# Patient Record
Sex: Male | Born: 1945 | Race: White | Hispanic: No | Marital: Married | Smoking: Former smoker
Health system: Southern US, Community
[De-identification: ages and names within clinical notes are randomized; demographics above are authoritative.]

## PROBLEM LIST (undated history)

## (undated) DIAGNOSIS — G20 Parkinson's disease: Secondary | ICD-10-CM

## (undated) DIAGNOSIS — G20A1 Parkinson's disease without dyskinesia, without mention of fluctuations: Secondary | ICD-10-CM

## (undated) DIAGNOSIS — K469 Unspecified abdominal hernia without obstruction or gangrene: Secondary | ICD-10-CM

## (undated) HISTORY — DX: Unspecified abdominal hernia without obstruction or gangrene: K46.9

## (undated) HISTORY — DX: Parkinson's disease: G20

## (undated) HISTORY — DX: Parkinson's disease without dyskinesia, without mention of fluctuations: G20.A1

## (undated) HISTORY — PX: HERNIA REPAIR: SHX51

---

## 2001-12-26 ENCOUNTER — Emergency Department (HOSPITAL_COMMUNITY): Admission: EM | Admit: 2001-12-26 | Discharge: 2001-12-26 | Payer: Self-pay | Admitting: Emergency Medicine

## 2005-12-02 ENCOUNTER — Encounter: Admission: RE | Admit: 2005-12-02 | Discharge: 2005-12-02 | Payer: Self-pay | Admitting: General Surgery

## 2008-10-10 ENCOUNTER — Encounter: Admission: RE | Admit: 2008-10-10 | Discharge: 2008-10-10 | Payer: Self-pay | Admitting: Family Medicine

## 2014-05-11 ENCOUNTER — Other Ambulatory Visit: Payer: Self-pay | Admitting: Family Medicine

## 2014-05-11 DIAGNOSIS — R296 Repeated falls: Secondary | ICD-10-CM

## 2014-05-13 ENCOUNTER — Ambulatory Visit
Admission: RE | Admit: 2014-05-13 | Discharge: 2014-05-13 | Disposition: A | Discharge status: Home / Self Care | Payer: Medicare Other | Source: Ambulatory Visit | Attending: Family Medicine | Admitting: Family Medicine

## 2014-05-13 DIAGNOSIS — R296 Repeated falls: Secondary | ICD-10-CM

## 2014-05-13 MED ORDER — IOHEXOL 300 MG/ML  SOLN
75.0000 mL | Freq: Once | INTRAMUSCULAR | Status: AC | PRN
  Administered 2014-05-13: 75 mL via INTRAVENOUS

## 2014-06-08 ENCOUNTER — Ambulatory Visit: Payer: Medicare Other | Attending: Family Medicine | Admitting: Physical Therapy

## 2014-06-08 DIAGNOSIS — Z9181 History of falling: Secondary | ICD-10-CM | POA: Diagnosis not present

## 2014-06-08 DIAGNOSIS — R269 Unspecified abnormalities of gait and mobility: Secondary | ICD-10-CM | POA: Insufficient documentation

## 2014-06-08 DIAGNOSIS — G20 Parkinson's disease: Secondary | ICD-10-CM | POA: Diagnosis not present

## 2014-06-08 DIAGNOSIS — IMO0001 Reserved for inherently not codable concepts without codable children: Secondary | ICD-10-CM | POA: Diagnosis present

## 2014-06-08 DIAGNOSIS — G20A1 Parkinson's disease without dyskinesia, without mention of fluctuations: Secondary | ICD-10-CM | POA: Insufficient documentation

## 2014-06-14 ENCOUNTER — Ambulatory Visit: Payer: Medicare Other | Admitting: Physical Therapy

## 2014-06-14 DIAGNOSIS — IMO0001 Reserved for inherently not codable concepts without codable children: Secondary | ICD-10-CM | POA: Diagnosis not present

## 2014-06-17 ENCOUNTER — Ambulatory Visit: Payer: Medicare Other | Admitting: Physical Therapy

## 2014-06-17 DIAGNOSIS — IMO0001 Reserved for inherently not codable concepts without codable children: Secondary | ICD-10-CM | POA: Diagnosis not present

## 2014-06-20 ENCOUNTER — Ambulatory Visit: Payer: Medicare Other | Admitting: Physical Therapy

## 2014-06-22 ENCOUNTER — Ambulatory Visit: Payer: Medicare Other | Admitting: Physical Therapy

## 2014-06-22 DIAGNOSIS — IMO0001 Reserved for inherently not codable concepts without codable children: Secondary | ICD-10-CM | POA: Diagnosis not present

## 2014-06-27 ENCOUNTER — Ambulatory Visit: Payer: Medicare Other | Admitting: Physical Therapy

## 2014-06-27 DIAGNOSIS — IMO0001 Reserved for inherently not codable concepts without codable children: Secondary | ICD-10-CM | POA: Diagnosis not present

## 2014-06-29 ENCOUNTER — Ambulatory Visit: Payer: Medicare Other | Attending: Family Medicine | Admitting: Physical Therapy

## 2014-06-29 DIAGNOSIS — Z9181 History of falling: Secondary | ICD-10-CM | POA: Insufficient documentation

## 2014-06-29 DIAGNOSIS — G20A1 Parkinson's disease without dyskinesia, without mention of fluctuations: Secondary | ICD-10-CM | POA: Insufficient documentation

## 2014-06-29 DIAGNOSIS — G20 Parkinson's disease: Secondary | ICD-10-CM | POA: Insufficient documentation

## 2014-06-29 DIAGNOSIS — IMO0001 Reserved for inherently not codable concepts without codable children: Secondary | ICD-10-CM | POA: Insufficient documentation

## 2014-06-29 DIAGNOSIS — R269 Unspecified abnormalities of gait and mobility: Secondary | ICD-10-CM | POA: Insufficient documentation

## 2014-07-06 ENCOUNTER — Ambulatory Visit: Payer: Medicare Other | Admitting: Physical Therapy

## 2014-07-06 DIAGNOSIS — IMO0001 Reserved for inherently not codable concepts without codable children: Secondary | ICD-10-CM | POA: Diagnosis not present

## 2014-07-11 ENCOUNTER — Ambulatory Visit: Payer: Medicare Other | Admitting: Physical Therapy

## 2014-07-11 DIAGNOSIS — IMO0001 Reserved for inherently not codable concepts without codable children: Secondary | ICD-10-CM | POA: Diagnosis not present

## 2014-07-13 ENCOUNTER — Ambulatory Visit: Payer: Medicare Other | Admitting: Physical Therapy

## 2014-07-13 DIAGNOSIS — IMO0001 Reserved for inherently not codable concepts without codable children: Secondary | ICD-10-CM | POA: Diagnosis not present

## 2014-07-18 ENCOUNTER — Ambulatory Visit: Payer: Medicare Other | Admitting: Physical Therapy

## 2014-07-18 DIAGNOSIS — IMO0001 Reserved for inherently not codable concepts without codable children: Secondary | ICD-10-CM | POA: Diagnosis not present

## 2014-07-20 ENCOUNTER — Ambulatory Visit: Payer: Medicare Other | Admitting: Physical Therapy

## 2014-07-20 DIAGNOSIS — IMO0001 Reserved for inherently not codable concepts without codable children: Secondary | ICD-10-CM | POA: Diagnosis not present

## 2014-07-25 ENCOUNTER — Ambulatory Visit: Payer: Medicare Other | Admitting: Physical Therapy

## 2014-07-25 DIAGNOSIS — IMO0001 Reserved for inherently not codable concepts without codable children: Secondary | ICD-10-CM | POA: Diagnosis not present

## 2014-07-27 ENCOUNTER — Ambulatory Visit: Payer: Medicare Other | Admitting: Physical Therapy

## 2014-07-27 DIAGNOSIS — IMO0001 Reserved for inherently not codable concepts without codable children: Secondary | ICD-10-CM | POA: Diagnosis not present

## 2014-08-03 ENCOUNTER — Ambulatory Visit: Payer: Medicare Other | Attending: Family Medicine | Admitting: Physical Therapy

## 2014-08-03 DIAGNOSIS — R279 Unspecified lack of coordination: Secondary | ICD-10-CM | POA: Insufficient documentation

## 2014-08-03 DIAGNOSIS — R269 Unspecified abnormalities of gait and mobility: Secondary | ICD-10-CM | POA: Diagnosis not present

## 2014-08-03 DIAGNOSIS — F0280 Dementia in other diseases classified elsewhere without behavioral disturbance: Secondary | ICD-10-CM | POA: Diagnosis not present

## 2014-08-03 DIAGNOSIS — G20 Parkinson's disease: Secondary | ICD-10-CM | POA: Insufficient documentation

## 2014-08-05 ENCOUNTER — Ambulatory Visit: Payer: Medicare Other | Admitting: Physical Therapy

## 2014-08-05 DIAGNOSIS — G20 Parkinson's disease: Secondary | ICD-10-CM | POA: Diagnosis not present

## 2015-02-21 ENCOUNTER — Ambulatory Visit: Payer: Medicare Other | Admitting: Occupational Therapy

## 2015-02-21 ENCOUNTER — Ambulatory Visit: Payer: Medicare Other | Attending: Family Medicine | Admitting: Physical Therapy

## 2015-02-21 DIAGNOSIS — F0280 Dementia in other diseases classified elsewhere without behavioral disturbance: Secondary | ICD-10-CM | POA: Insufficient documentation

## 2015-02-21 DIAGNOSIS — R279 Unspecified lack of coordination: Secondary | ICD-10-CM

## 2015-02-21 DIAGNOSIS — R269 Unspecified abnormalities of gait and mobility: Secondary | ICD-10-CM | POA: Insufficient documentation

## 2015-02-21 DIAGNOSIS — G20 Parkinson's disease: Secondary | ICD-10-CM | POA: Insufficient documentation

## 2015-02-21 NOTE — Therapy (Signed)
[facility] [address] [place], [place], [address] Phone: [phone]   Fax:  [phone]  Physical Therapy Treatment  Patient Details  Name: Manuel Jackson MRN: [id number] Date of Birth: [birth year] Referring Provider:  Blair HeysEhinger, Robert, MD  Encounter Date: 02/21/2015    No past medical history on file.  No past surgical history on file.  There were no vitals filed for this visit.  Visit Diagnosis:  Abnormality of gait       Physical Therapy Parkinson's Disease Screen   Timed Up and Go test:15.77 seconds  10 meter walk test: 3.39 ft/sec  5 time sit to stand test:10.15 seconds with posterior lean  Patient would benefit from Physical Therapy evaluation due to slight decline in functional mobility, continued freezing with gait, difficulty with bed mobility per patient report.                                         Problem List There are no active problems to display for this patient.   Ona Rathert W. 02/21/2015, 2:09 PM  Lonia BloodAmy Jamieka Royle, PT 02/21/2015 2:12 PM Phone: [phone] Fax: [phone]   [facility] [address] [place], [place], [address] Phone: [phone]   Fax:  [phone]

## 2015-02-21 NOTE — Therapy (Signed)
[facility] [address] [place], [place], [address] Phone: [phone]   Fax:  [phone]  [facility]  Patient Details  Name: Manuel IrishWilliam E Eliasen MRN: [id number] Date of Birth: 1946-10-26 Referring Provider:  Blair HeysEhinger, Robert, MD  Encounter Date: 02/21/2015    No past medical history on file.  No past surgical history on file.  There were no vitals filed for this visit.  Visit Diagnosis:  Lack of coordination      Occupational Therapy Parkinson's Disease Screen  Physical Performance Test item #4 (donning/doffing jacket):  18.63sec  9-hole peg test:    RUE  37.10sec        LUE  36.03sec  Box & Blocks Test:   RUE  37 blocks        LUE  46 blocks  Change in ability to perform ADLs/IADLs:  Has had 2 falls (step ladder, fell in parking lot), difficulty getting out of bed.     Pt would benefit from occupational therapy evaluation due to  Decreased coordination/functional reaching.                                    Problem List There are no active problems to display for this patient.   [facility] ClinicFREEMAN,ANGELA 02/21/2015, 1:51 PM  [facility] [address] [place], [place], [address] Phone: [phone]   Fax:  [phone]  Willa Fraterngela Freeman, OTR/L 02/21/2015 1:51 PM

## 2015-08-01 ENCOUNTER — Ambulatory Visit (INDEPENDENT_AMBULATORY_CARE_PROVIDER_SITE_OTHER): Payer: Medicare Other | Admitting: Neurology

## 2015-08-01 ENCOUNTER — Encounter: Payer: Self-pay | Admitting: Neurology

## 2015-08-01 VITALS — BP 110/60 | HR 73 | Wt 178.5 lb

## 2015-08-01 DIAGNOSIS — G249 Dystonia, unspecified: Secondary | ICD-10-CM | POA: Diagnosis not present

## 2015-08-01 DIAGNOSIS — F0280 Dementia in other diseases classified elsewhere without behavioral disturbance: Secondary | ICD-10-CM | POA: Diagnosis not present

## 2015-08-01 DIAGNOSIS — G20A1 Parkinson's disease without dyskinesia, without mention of fluctuations: Secondary | ICD-10-CM

## 2015-08-01 DIAGNOSIS — G20 Parkinson's disease: Secondary | ICD-10-CM

## 2015-08-01 DIAGNOSIS — G4752 REM sleep behavior disorder: Secondary | ICD-10-CM | POA: Diagnosis not present

## 2015-08-01 DIAGNOSIS — G20B1 Parkinson's disease with dyskinesia, without mention of fluctuations: Secondary | ICD-10-CM

## 2015-08-01 MED ORDER — CARBIDOPA-LEVODOPA ER 50-200 MG PO TBCR: 1.0000 | EXTENDED_RELEASE_TABLET | Freq: Every day | ORAL | Status: DC

## 2015-08-01 NOTE — Progress Notes (Signed)
Note routed to Dr Ehinger.  

## 2015-08-01 NOTE — Patient Instructions (Signed)
1. Start Carbidopa Levodopa 50/200 at night (8:00 pm).  2. Take Carbidopa Levodopa 25/100 IR take two tablet at 6:00 AM then spread 7 remaining tablets out through the day.

## 2015-08-01 NOTE — Progress Notes (Signed)
Manuel Jackson was seen today in the movement disorders clinic for neurologic consultation at the request of Dr. [name].  His PCP is [name], MD.  The patient is seen today in neurologic consultation, accompanied by his wife who supplements the history.  He has been taken care of for many years by Dr. [name], but the drive to [facility] has become long and cumbersome for him and he is trying to find a neurologist closer to home.  I have reviewed and appreciated the records from Dr. [name].  He first saw Dr. [name] and was diagnosed with Parkinson's disease in approximately 2008, when Dr. [name] was with [name] neurology.  He followed Dr. [name] to [name].  The patient's first symptom was right hand rest tremor.  He was started on Azilect first and Mirapex came not long thereafter.  He has been on levodopa since approximately 2011.  The patient felt that each of these medications helped either tremor or balance or both at the initiation of the medication, but is having more difficulty now with freezing and on/off.  He developed hallucinations in approximately May, 2013 and did not fully respond to quetiapine.  He saw [name] for a one-time consultation in 2013 and [name] decided to taper him off of Mirapex.  Unfortunately, the patient became very stiff off of Mirapex and he ended up going back on the drug and is now back on it at pramipexole, 0.25 mg 4 times per day.  He is also on the Exelon patch 9.5 mg daily, Namenda 10 mg twice a day as well as his Seroquel 25 mg bid; he is still having hallucinations despite this.  As above, he has had more difficulties with motor fluctuations and called Dr. [name] in June and his dose of carbidopa/levodopa was adjusted slightly so that he is now taking 2 tablets at 8 AM/2 at noon/1 tablet at 2 PM/1 tablet at 4 PM/2 tablets at 6:30 PM and one tablet at bedtime.  Pt states that his medication works right after he takes the medication but by 11 am he  is frozen.  He states that medication wears out after 3 hours.  His wife states that she has been taking the 2 tablets at noon and starting giving one at 10 am and one at noon and that works better.  Interestingly, he wakes up at 6 am but doesn't take first med until 8am.     Specific Symptoms:  Tremor: Yes.  , R hand and R leg (R hand dominant) Voice: hypophonic Sleep: sleeps well  Vivid Dreams:  Yes.    Acting out dreams:  Yes.   Wet Pillows: No. Postural symptoms:  Yes.    Falls?  Yes.   (last fall 3 weeks ago; never had a fx with a fall) - does PT in [place]) Bradykinesia symptoms: difficulty with initiating movement, shuffling gait, slow movements and difficulty getting out of a chair Loss of smell:  Yes.   Loss of taste:  No. Urinary Incontinence:  No. Difficulty Swallowing:  No. Handwriting, micrographia: Yes.   Trouble with ADL's:  Yes.    Trouble buttoning clothing: Yes.   Depression:  Yes.   (more frustrated than depressed) Memory changes:  Yes.  ; pt does not drives - quit driving a year ago; wife gives meds and if she is gone, she will set an alarm for him Hallucinations:  Yes.   (happens most days; will see people he used to work  with; also sees smoke that others don't see)  visual distortions: Yes.   N/V:  No. Lightheaded:  No.  Syncope: No. Diplopia:  No. Dyskinesia:  No.  MRI neuroimaging has not previously been performed.  PREVIOUS MEDICATIONS: Sinemet, Mirapex and Seroquel; he tried klonopin - 0.5 mg - full tablet at bedtime but seemed to "make worse"  ALLERGIES:   Allergies  Allergen Reactions  . Naproxen Swelling    CURRENT MEDICATIONS:  Outpatient Encounter Prescriptions as of 08/01/2015  Medication Sig  . hydrochlorothiazide (HYDRODIURIL) 25 MG tablet Take by mouth.  . pramipexole (MIRAPEX) 0.5 MG tablet Take by mouth.  . QUEtiapine (SEROQUEL) 25 MG tablet TAKE 1 TABLET BY MOUTH TWICE A DAY  . rivastigmine (EXELON) 9.5 mg/24hr Place onto  the skin.  . [DISCONTINUED] rasagiline (AZILECT) 1 MG TABS tablet TAKE 1 TABLET BY MOUTH EVERY DAY  . AZILECT 1 MG TABS tablet   . carbidopa-levodopa (SINEMET IR) 25-100 MG tablet   . memantine (NAMENDA) 10 MG tablet   . [DISCONTINUED] Cholecalciferol (VITAMIN D-1000 MAX ST) 1000 UNITS tablet Take by mouth.  . [DISCONTINUED] PEG 3350-KCl-NaBcb-NaCl-NaSulf (PEG-3350/ELECTROLYTES) 236 G SOLR    No facility-administered encounter medications on file as of 08/01/2015.    PAST MEDICAL HISTORY:   Past Medical History  Diagnosis Date  . Parkinson's disease (HCC)   . Hernia of abdominal cavity     PAST SURGICAL HISTORY:   Past Surgical History  Procedure Laterality Date  . Hernia repair      SOCIAL HISTORY:   Social History   Social History  . Marital Status: Married    Spouse Name: N/A  . Number of Children: N/A  . Years of Education: N/A   Occupational History  . Not on file.   Social History Main Topics  . Smoking status: Former Games developer  . Smokeless tobacco: Never Used  . Alcohol Use: 0.0 oz/week    0 Standard drinks or equivalent per week  . Drug Use: No  . Sexual Activity: Not on file   Other Topics Concern  . Not on file   Social History Narrative   Lives with wife in story home.  Has no children.  Retired Dietitian for [employer].  Education: associate's degree.    FAMILY HISTORY:   Family Status  Relation Status Death Age  . Mother Deceased   . Father Deceased   . Brother Deceased     ROS:  A complete 10 system review of systems was obtained and was unremarkable apart from what is mentioned above.  PHYSICAL EXAMINATION:    VITALS:   Filed Vitals:   08/01/15 1226  BP: 110/60  Pulse: 73  Weight: 178 lb 8 oz (80.967 kg)  SpO2: 93%    GEN:  The patient appears stated age and is in NAD. HEENT:  Normocephalic, atraumatic.  The mucous membranes are moist. The superficial temporal arteries are without ropiness or tenderness. CV:  RRR Lungs:   CTAB Neck/HEME:  There are no carotid bruits bilaterally.  Neurological examination:  Orientation: He scored an 8/30 on his MoCA.  The patient is alert and oriented x3. Fund of knowledge is appropriate.  Recent and remote memory are intact.  Attention and concentration are normal.    Able to name objects and repeat phrases. Cranial nerves: There is good facial symmetry.  There is significant facial hypomimia.   Pupils are equal round and reactive to light bilaterally. Fundoscopic exam reveals clear margins bilaterally. Extraocular muscles are intact. The  visual fields are full to confrontational testing. The speech is fluent and clear. Soft palate rises symmetrically and there is no tongue deviation. Hearing is intact to conversational tone. Sensation: Sensation is intact to light and pinprick throughout (facial, trunk, extremities). Vibration is decreased at the bilateral big toe. There is no extinction with double simultaneous stimulation. There is no sensory dermatomal level identified. Motor: Strength is 5/5 in the bilateral upper and lower extremities.   Shoulder shrug is equal and symmetric.  There is no pronator drift. Deep tendon reflexes: Deep tendon reflexes are 2/4 at the bilateral biceps, triceps, brachioradialis, patella and 1/4 at the bilateral achilles. Plantar responses are downgoing bilaterally.  Movement examination: Tone: There is normal tone in the bilateral upper extremities.  The tone in the lower extremities is normal.  Abnormal movements: Rare tremor in the left thumb; he is mildly dyskinetic Coordination:  There is mild decremation with RAM's, with all form of RAMS, including alternating supination and pronation of the forearm, hand opening and closing, finger taps, heel taps and toe taps.  However, apraxia with all commands is a big issue when testing this.   Gait and Station: The patient has minimal difficulty arising out of a deep-seated chair without the use of the hands.  The patient's stride length is decreased with normal arm swing (slightly exaggerated with dyskinesia on the right).  Pull test is attempted but he is too apraxic to understand the directions.    ASSESSMENT/PLAN:  1.  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  May take an extra if needed throughout the day  -add carbidopa/levodopa 50/200 CR at bedtime  -talked about nuplazid for the hallucinations/parkinsons psychosis since seroquel not controlling.  Will discuss in future as didn't want to change too many things at one time.  Continue seroquel 25 mg bid for now  -talked extensively about duopa.  Think he could be a good candidate.  Asked them to think about this option given significant on/off  -continue PT in [place]  -stressed importance of safe, CV exercise 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  Will need to monitor.  If need in future, will try 1/2 of the tablet or even less.   3.  PDD  -continue exelon patch 9.5 mg daily  -on namenda 10 mg bid.  -home safety discussed in detail.  Shouldn't be left alone. 4.  Follow up in 8 weeks.  Much greater than 50% of this visit was spent in counseling with the patient and the family.  Total face to face time:  65 min

## 2015-09-19 ENCOUNTER — Telehealth: Payer: Self-pay | Admitting: Neurology

## 2015-09-19 NOTE — Telephone Encounter (Signed)
[name]) called to inform us that patient had a fall yesterday. He did not hit his head or lose consciousness. He has some bruises on his left hand. He is also having hallucinations and insomnia for the past 3 nights. It looks like this was discussed last office visit. He has a follow up on 09/28/15.

## 2015-09-28 ENCOUNTER — Encounter: Payer: Self-pay | Admitting: Neurology

## 2015-09-28 ENCOUNTER — Ambulatory Visit (INDEPENDENT_AMBULATORY_CARE_PROVIDER_SITE_OTHER): Payer: Medicare Other | Admitting: Neurology

## 2015-09-28 VITALS — BP 116/60 | HR 65 | Ht 67.0 in | Wt 173.0 lb

## 2015-09-28 DIAGNOSIS — G249 Dystonia, unspecified: Secondary | ICD-10-CM | POA: Diagnosis not present

## 2015-09-28 DIAGNOSIS — F0280 Dementia in other diseases classified elsewhere without behavioral disturbance: Secondary | ICD-10-CM

## 2015-09-28 DIAGNOSIS — G20 Parkinson's disease: Secondary | ICD-10-CM

## 2015-09-28 DIAGNOSIS — G4752 REM sleep behavior disorder: Secondary | ICD-10-CM | POA: Diagnosis not present

## 2015-09-28 DIAGNOSIS — G20B1 Parkinson's disease with dyskinesia, without mention of fluctuations: Secondary | ICD-10-CM

## 2015-09-28 DIAGNOSIS — G20A1 Parkinson's disease without dyskinesia, without mention of fluctuations: Secondary | ICD-10-CM

## 2015-09-28 NOTE — Progress Notes (Signed)
Manuel Jackson was seen today in the movement disorders clinic for neurologic consultation at the request of [name].  His PCP is Manuel Huh, MD.  The patient is seen today in neurologic consultation, accompanied by his wife who supplements the history.  He has been taken care of for many years by [name], but the drive to [facility] has become long and cumbersome for him and he is trying to find a neurologist closer to home.  I have reviewed and appreciated the records from [name].  He first saw [name] and was diagnosed with Parkinson's disease in approximately 2008, when [name] was with [facility] neurology.  He followed [name] to [facility].  The patient's first symptom was right hand rest tremor.  He was started on Azilect first and Mirapex came not long thereafter.  He has been on levodopa since approximately 2011.  The patient felt that each of these medications helped either tremor or balance or both at the initiation of the medication, but is having more difficulty now with freezing and on/off.  He developed hallucinations in approximately May, 2013 and did not fully respond to quetiapine.  He saw [name] for a one-time consultation in 2013 and [name] decided to taper him off of Mirapex.  Unfortunately, the patient became very stiff off of Mirapex and he ended up going back on the drug and is now back on it at pramipexole, 0.25 mg 4 times per day.  He is also on the Exelon patch 9.5 mg daily, Namenda 10 mg twice a day as well as his Seroquel 25 mg bid; he is still having hallucinations despite this.  As above, he has had more difficulties with motor fluctuations and called [name] in June and his dose of carbidopa/levodopa was adjusted slightly so that he is now taking 2 tablets at 8 AM/2 at noon/1 tablet at 2 PM/1 tablet at 4 PM/2 tablets at 6:30 PM and one tablet at bedtime.  Pt states that his medication works right after he takes the medication but by 11 am he  is frozen.  He states that medication wears out after 3 hours.  His wife states that she has been taking the 2 tablets at noon and starting giving one at 10 am and one at noon and that works better.  Interestingly, he wakes up at 6 am but doesn't take first med until 8am.     09/28/15 update:  The patient is following up today, accompanied by his wife who supplements the history.  He has a history of Parkinson's disease with Parkinson's disease dementia.  He is on carbidopa/levodopa 25/100, 2 tablets in the morning and 7 other tablets spread evenly throughout the day.  Last visit we added carbidopa/levodopa 50/200 at nighttime.  Despite attempts in the past by his previous physicians to get him off of Mirapex, he ended up going back on it and is on Mirapex, 0.25 mg 4 times per day.  He is on Seroquel 25 mg twice a day and last visit we discussed adding Nuplazid.  We also discussed the possibilities of duopa because of motor fluctuations.  He is on Exelon patch, 9.5 mg daily and Namenda, 10 mg twice a day.  He continues to have hallucinations.  He has had a few falls since last visit and the days that he falls are the days that he has not slept the night before.  He still isn't sleeping well.  He refuses to  use a walker or ambulatory assistive device.  He has a pressure sore.  His wife states that she thinks that he got it from when she had to pull him by his heels across the floor to move him when he was frozen.  She states that it is almost healed now.  He had graduated from PT but has restarted it yesterday.    PREVIOUS MEDICATIONS: Sinemet, Mirapex and Seroquel; he tried klonopin - 0.5 mg - full tablet at bedtime but seemed to "make worse"  ALLERGIES:   Allergies  Allergen Reactions  . Naproxen Swelling    CURRENT MEDICATIONS:  Outpatient Encounter Prescriptions as of 09/28/2015  Medication Sig  . AZILECT 1 MG TABS tablet Take 1 mg by mouth daily.   . carbidopa-levodopa (SINEMET CR) 50-200 MG  tablet Take 1 tablet by mouth at bedtime.  . carbidopa-levodopa (SINEMET IR) 25-100 MG tablet Take 9 tablets daily  . cholecalciferol (VITAMIN D) 1000 UNITS tablet Take 1,000 Units by mouth daily.  . hydrochlorothiazide (HYDRODIURIL) 25 MG tablet Take 25 mg by mouth daily.   . memantine (NAMENDA) 10 MG tablet   . pramipexole (MIRAPEX) 0.5 MG tablet Take 0.25 mg by mouth 4 (four) times daily.   . QUEtiapine (SEROQUEL) 25 MG tablet TAKE 1 TABLET BY MOUTH TWICE A DAY  . rivastigmine (EXELON) 9.5 mg/24hr Place 9.5 mg onto the skin daily.    No facility-administered encounter medications on file as of 09/28/2015.    PAST MEDICAL HISTORY:   Past Medical History  Diagnosis Date  . Parkinson's disease (HCC)   . Hernia of abdominal cavity     PAST SURGICAL HISTORY:   Past Surgical History  Procedure Laterality Date  . Hernia repair      SOCIAL HISTORY:   Social History   Social History  . Marital Status: Married    Spouse Name: N/A  . Number of Children: N/A  . Years of Education: N/A   Occupational History  . Not on file.   Social History Main Topics  . Smoking status: Former Games developermoker  . Smokeless tobacco: Never Used  . Alcohol Use: 0.0 oz/week    0 Standard drinks or equivalent per week  . Drug Use: No  . Sexual Activity: Not on file   Other Topics Concern  . Not on file   Social History Narrative   Lives with wife in story home.  Has no children.  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  Education: associate's degree.    FAMILY HISTORY:   Family Status  Relation Status Death Age  . Mother Deceased   . Father Deceased   . Brother Deceased     ROS:  A complete 10 system review of systems was obtained and was unremarkable apart from what is mentioned above.  PHYSICAL EXAMINATION:    VITALS:   Filed Vitals:   09/28/15 1259  BP: 116/60  Pulse: 65  Height: 5\' 7"  (1.702 m)  Weight: 173 lb (78.472 kg)    GEN:  The patient appears stated age and is in NAD. HEENT:   Normocephalic, atraumatic.  The mucous membranes are moist. The superficial temporal arteries are without ropiness or tenderness. CV:  RRR Lungs:  CTAB Neck/HEME:  There are no carotid bruits bilaterally.  Neurological examination:  Orientation: He scored an 8/30 on his MoCA last visit.  The patient is alert and oriented x3 today.  He is actually able to remember our discussion about duopa last visit Cranial nerves: There is  good facial symmetry.  There is significant facial hypomimia.    The speech is fluent and clear. Soft palate rises symmetrically and there is no tongue deviation. Hearing is intact to conversational tone. Sensation: Sensation is intact to light touch throughout. Motor: Strength is 5/5 in the bilateral upper and lower extremities.   Shoulder shrug is equal and symmetric.  There is no pronator drift.   Movement examination: Tone: There is normal tone in the bilateral upper extremities.  The tone in the lower extremities is normal.  Abnormal movements: Rare tremor in the left thumb; he is mildly dyskinetic, particularly in the left leg Coordination:  There is mild decremation with RAM's, with all form of RAMS, including alternating supination and pronation of the forearm, hand opening and closing, finger taps, heel taps and toe taps.  However, apraxia with all commands is a big issue when testing this.   Gait and Station: The patient has no difficulty arising out of a deep-seated chair without the use of the hands. The patient's stride length is good today  ASSESSMENT/PLAN:  1.  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  He is having difficulty sleeping at night.  -talked again  about nuplazid for the hallucinations/parkinsons psychosis since seroquel not controlling.  There are many people who are using Nuplazid and Seroquel together, although this certainly can increase risk with QT interval prolongation.  The patient is having a lot of hallucinations, especially gustatory hallucinations (smoke).  We can discuss this further in the future.    -Drop Mirapex from 0.25 mg 4 times per day to 0.25 mg twice a day for 2 weeks.  I will then call them and see how he is doing and plan to discontinue it if doing well  -talked again about duopa.  Think he could be a good candidate.  Asked them to think about this option given significant on/off  -continue PT in [place] 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  Will need to monitor.  If need in future, will try 1/2 of the tablet or even less.   3.  PDD  -continue exelon patch 9.5 mg daily  -on namenda 10 mg bid.  -home safety discussed in detail.  Shouldn't be left alone. 4.  Follow up in 8-12 weeks.  Much greater than 50% of this visit was spent in counseling with the patient and the family.  Total face to face time:  35 min

## 2015-09-28 NOTE — Patient Instructions (Addendum)
1. Change Quetiapine from 25 mg twice daily to 2 tablets at bedtime.  2. Decrease Mirapex from 1/2 tablet 4 times daily to 1/2 tablet twice daily. We will call in two weeks to see how you are doing on this dose.

## 2015-10-12 ENCOUNTER — Telehealth: Payer: Self-pay | Admitting: Neurology

## 2015-10-12 NOTE — Telephone Encounter (Signed)
Spoke with patient's wife to see how he is doing after switching to the 1/2 tablet twice daily of Mirapex from 1/2 tablet four times daily. She states last week he did well but this week he has had a rough time getting around and had a bad episode of freezing yesterday. They were in [place] and he had to take an extra levodopa- but the time they got him home he was okay. He is having hardly no hallucinations anymore. His wife things his trouble getting around is due to his consistent lack of sleep. He is not sleeping well at all and wakes up every morning at 4 am. Please advise.

## 2015-10-12 NOTE — Telephone Encounter (Signed)
Willing to consider duopa?  Taking seroquel at night (2 tablets?)

## 2015-10-12 NOTE — Telephone Encounter (Signed)
Patient is still taking Seroquel at night. He is willing to consider Levodopa. [place] so will sign the paperwork to see about cost when they come in for their follow up appt on 11/03/15. Wife is very concerned about his sleep. He has never had a sleep study. I advised them to talk to their PCP about sleep trouble and maybe get a sleep study or see a sleep medicine specialist. She will do this.

## 2015-10-28 ENCOUNTER — Other Ambulatory Visit: Payer: Self-pay | Admitting: Neurology

## 2015-10-31 NOTE — Telephone Encounter (Signed)
Carbidopa Levodopa 50/200 refill requested. Per last office note- patient to remain on medication. Refill approved and sent to patient's pharmacy.   

## 2015-11-03 ENCOUNTER — Ambulatory Visit: Payer: Medicare Other | Admitting: Neurology

## 2015-11-07 ENCOUNTER — Other Ambulatory Visit: Payer: Self-pay | Admitting: Neurology

## 2015-11-07 ENCOUNTER — Telehealth: Payer: Self-pay | Admitting: Neurology

## 2015-11-07 NOTE — Telephone Encounter (Signed)
VM-PT left a message saying she got disconnected/Dawn CB# [phone] or [phone]

## 2015-11-07 NOTE — Telephone Encounter (Signed)
Patient had requested refill of Seroquel from the pharmacy but thought they were cut off when leaving the message to ask for the refill. Refill request was received and already approved.

## 2015-11-07 NOTE — Telephone Encounter (Signed)
Seroquel refill requested. Per last office note- patient to remain on medication. Refill approved and sent to patient's pharmacy.   

## 2015-11-14 ENCOUNTER — Encounter: Payer: Self-pay | Admitting: Neurology

## 2015-11-14 ENCOUNTER — Ambulatory Visit (INDEPENDENT_AMBULATORY_CARE_PROVIDER_SITE_OTHER): Payer: Medicare Other | Admitting: Neurology

## 2015-11-14 VITALS — BP 112/60 | HR 67 | Ht 67.0 in | Wt 178.0 lb

## 2015-11-14 DIAGNOSIS — G20A1 Parkinson's disease without dyskinesia, without mention of fluctuations: Secondary | ICD-10-CM

## 2015-11-14 DIAGNOSIS — Z515 Encounter for palliative care: Secondary | ICD-10-CM

## 2015-11-14 DIAGNOSIS — G20 Parkinson's disease: Secondary | ICD-10-CM

## 2015-11-14 DIAGNOSIS — G4700 Insomnia, unspecified: Secondary | ICD-10-CM | POA: Diagnosis not present

## 2015-11-14 DIAGNOSIS — R441 Visual hallucinations: Secondary | ICD-10-CM | POA: Diagnosis not present

## 2015-11-14 DIAGNOSIS — G249 Dystonia, unspecified: Secondary | ICD-10-CM | POA: Diagnosis not present

## 2015-11-14 DIAGNOSIS — F0280 Dementia in other diseases classified elsewhere without behavioral disturbance: Secondary | ICD-10-CM

## 2015-11-14 DIAGNOSIS — G20B1 Parkinson's disease with dyskinesia, without mention of fluctuations: Secondary | ICD-10-CM

## 2015-11-14 MED ORDER — AZILECT 1 MG PO TABS: 1.0000 mg | ORAL_TABLET | Freq: Every day | ORAL | Status: DC

## 2015-11-14 NOTE — Progress Notes (Signed)
Note routed to Dr Manus Gunning.

## 2015-11-14 NOTE — Progress Notes (Signed)
Manuel Jackson was seen today in the movement disorders clinic for neurologic consultation at the request of [name].  His PCP is [name], MD.  The patient is seen today in neurologic consultation, accompanied by his wife who supplements the history.  He has been taken care of for many years by [name], but the drive to [facility] has become long and cumbersome for him and he is trying to find a neurologist closer to home.  I have reviewed and appreciated the records from [name].  He first saw [name] and was diagnosed with Parkinson's disease in approximately 2008, when [name] was with [facility] neurology.  He followed [name] to [facility].  The patient's first symptom was right hand rest tremor.  He was started on Azilect first and Mirapex came not long thereafter.  He has been on levodopa since approximately 2011.  The patient felt that each of these medications helped either tremor or balance or both at the initiation of the medication, but is having more difficulty now with freezing and on/off.  He developed hallucinations in approximately May, 2013 and did not fully respond to quetiapine.  He saw [name] for a one-time consultation in 2013 and [name] decided to taper him off of Mirapex.  Unfortunately, the patient became very stiff off of Mirapex and he ended up going back on the drug and is now back on it at pramipexole, 0.25 mg 4 times per day.  He is also on the Exelon patch 9.5 mg daily, Namenda 10 mg twice a day as well as his Seroquel 25 mg bid; he is still having hallucinations despite this.  As above, he has had more difficulties with motor fluctuations and called [name] in June and his dose of carbidopa/levodopa was adjusted slightly so that he is now taking 2 tablets at 8 AM/2 at noon/1 tablet at 2 PM/1 tablet at 4 PM/2 tablets at 6:30 PM and one tablet at bedtime.  Pt states that his medication works right after he takes the medication but by 11 am he  is frozen.  He states that medication wears out after 3 hours.  His wife states that she has been taking the 2 tablets at noon and starting giving one at 10 am and one at noon and that works better.  Interestingly, he wakes up at 6 am but doesn't take first med until 8am.     09/28/15 update:  The patient is following up today, accompanied by his wife who supplements the history.  He has a history of Parkinson's disease with Parkinson's disease dementia.  He is on carbidopa/levodopa 25/100, 2 tablets in the morning and 7 other tablets spread evenly throughout the day.  Last visit we added carbidopa/levodopa 50/200 at nighttime.  Despite attempts in the past by his previous physicians to get him off of Mirapex, he ended up going back on it and is on Mirapex, 0.25 mg 4 times per day.  He is on Seroquel 25 mg twice a day and last visit we discussed adding Nuplazid.  We also discussed the possibilities of duopa because of motor fluctuations.  He is on Exelon patch, 9.5 mg daily and Namenda, 10 mg twice a day.  He continues to have hallucinations.  He has had a few falls since last visit and the days that he falls are the days that he has not slept the night before.  He still isn't sleeping well.  He refuses to  use a walker or ambulatory assistive device.  He has a pressure sore.  His wife states that she thinks that he got it from when she had to pull him by his heels across the floor to move him when he was frozen.  She states that it is almost healed now.  He had graduated from PT but has restarted it yesterday.    11/14/15 update:  The patient has a history of Parkinson's disease with Parkinson's disease dementia.  He is on carbidopa/levodopa 25/100, 2 tablets in the morning, followed by 7 other dosages spread evenly throughout the day.  He is also on carbidopa/levodopa 50/200 at nighttime.  His wife will occasionally give him an extra levodopa (she did today because she knew she was coming here and he would  freeze).  Last visit, his quetiapine was changed from 25 mg twice a day to 50 mg at night.  This was primarily because of issues with insomnia. He is now going to bed at 8:30 pm and staying in bed without awakening until 7am.   We did, however, last visit talk some about Nuplazid.  This is because of hallucinations.  Also because of this, I dropped his pramipexole from 0.25 mg 4 times a day to 0.25 mg twice per day. Wife states that this is much better.  Rarely having hallucinations now.  Did not seem to have any bad consequences from dropping the pramipexole to a lower dosage.  I have been doing this cautiously because he was unable to get off of that in the past.  He does have a history of Parkinson's disease dementia and is on the Exelon patch as well as Namenda, 10 mg twice a day.  His wife brings me a picture of his pressure wound on his right buttocks and reports he is still doing wound care but it is getting better.  He is exercising on his cardiofit and has gone back to the [employer].  He fell twice since last visit; one time fell forward out of the chair while eating.  He didn't get hurt.  With the other, he was in the bathroom and didn't have his walker.  He was hanging his towel up.  Wife asks me about him doing a colonoscopy.  States that his primary care physician would like him to do that.  She is reluctant.  PREVIOUS MEDICATIONS: Sinemet, Mirapex and Seroquel; he tried klonopin - 0.5 mg - full tablet at bedtime but seemed to "make worse"  ALLERGIES:   Allergies  Allergen Reactions  . Naproxen Swelling    CURRENT MEDICATIONS:  Outpatient Encounter Prescriptions as of 11/14/2015  Medication Sig  . AZILECT 1 MG TABS tablet Take 1 tablet (1 mg total) by mouth daily.  . carbidopa-levodopa (SINEMET CR) 50-200 MG tablet TAKE 1 TABLET BY MOUTH AT BEDTIME.  . carbidopa-levodopa (SINEMET IR) 25-100 MG tablet 2 tablets at 8 am, 2 at 11 am, 2 at 3 pm, 2 at 6 pm  . cholecalciferol (VITAMIN D) 1000 UNITS  tablet Take 1,000 Units by mouth daily.  . hydrochlorothiazide (HYDRODIURIL) 25 MG tablet Take 25 mg by mouth daily.   . memantine (NAMENDA) 10 MG tablet Take 10 mg by mouth daily.   . pramipexole (MIRAPEX) 0.5 MG tablet Take 0.25 mg by mouth 2 (two) times daily. With 8 am Levodopa and 8 pm CR Levodopa  . QUEtiapine (SEROQUEL) 25 MG tablet Take 2 tablets (50 mg total) by mouth at bedtime.  . rivastigmine (EXELON) 9.5  mg/24hr Place 9.5 mg onto the skin daily.   . [DISCONTINUED] AZILECT 1 MG TABS tablet Take 1 mg by mouth daily.    No facility-administered encounter medications on file as of 11/14/2015.    PAST MEDICAL HISTORY:   Past Medical History  Diagnosis Date  . Parkinson's disease (HCC)   . Hernia of abdominal cavity     PAST SURGICAL HISTORY:   Past Surgical History  Procedure Laterality Date  . Hernia repair      SOCIAL HISTORY:   Social History   Social History  . Marital Status: Married    Spouse Name: N/A  . Number of Children: N/A  . Years of Education: N/A   Occupational History  . Not on file.   Social History Main Topics  . Smoking status: Former Games developer  . Smokeless tobacco: Never Used  . Alcohol Use: 0.0 oz/week    0 Standard drinks or equivalent per week  . Drug Use: No  . Sexual Activity: Not on file   Other Topics Concern  . Not on file   Social History Narrative   Lives with wife in story home.  Has no children.  Retired Dietitian for [employer].  Education: associate's degree.    FAMILY HISTORY:   Family Status  Relation Status Death Age  . Mother Deceased   . Father Deceased   . Brother Deceased     ROS:  A complete 10 system review of systems was obtained and was unremarkable apart from what is mentioned above.  PHYSICAL EXAMINATION:    VITALS:   Filed Vitals:   11/14/15 1244  BP: 112/60  Pulse: 67  Height:  (1.702 m)  Weight: 178 lb (80.74 kg)    GEN:  The patient appears stated age and is in NAD. HEENT:   Normocephalic, atraumatic.  The mucous membranes are moist. The superficial temporal arteries are without ropiness or tenderness. CV:  RRR Lungs:  CTAB Neck/HEME:  There are no carotid bruits bilaterally.  Neurological examination:  Orientation: He scored an 8/30 on his MoCA previously.  The patient is alert and oriented x3 today.   Cranial nerves: There is good facial symmetry.  There is significant facial hypomimia.    The speech is fluent and clear. Soft palate rises symmetrically and there is no tongue deviation. Hearing is intact to conversational tone. Sensation: Sensation is intact to light touch throughout. Motor: Strength is 5/5 in the bilateral upper and lower extremities.   Shoulder shrug is equal and symmetric.  There is no pronator drift.   Movement examination: Tone: There is normal tone in the bilateral upper extremities.  The tone in the lower extremities is normal.  Abnormal movements: Rare tremor in the left thumb; he is mildly dyskinetic, particularly in the left leg Coordination:  There is mild decremation with RAM's, with all form of RAMS, including alternating supination and pronation of the forearm, hand opening and closing, finger taps, heel taps and toe taps bilaterally.  However, apraxia with all commands is a big issue when testing this.   Gait and Station: The patient has no difficulty arising out of a deep-seated chair without the use of the hands. The patient's stride length is good today.  I did give him a walker to test, as his wife states that the walker at home has no wheals at all and he has to pick up the walker because of friction on the floor.  ASSESSMENT/PLAN:  1.  Parkinsons disease with  advanced Parkinsons disease dementia  -continue carbidopa/levodopa 25/100,  2 upon awakening then spread the other 7 out evenly throughout the day   May take an extra if needed throughout the day  -Continue carbidopa/levodopa 50/200 CR at bedtime  -Doing much better  with quetiapine, 50 mg at nighttime.  No longer having insomnia.  Hallucinations are markedly better.  They understand the black box warning.  Holding Nuplazid for now.  -Continue weaning Mirapex.  Decrease to 0.25 mg once a day for 2 weeks and then stop  -talked again about duopa.  Think he could be a good candidate.  Asked them to think about this option given significant on/off, but does look much better today.  Regardless, he is still having significant freezing according to his wife.  -Wrote a prescription for a new walker.  -Talked extensively about end-of-life issues.  Talked about updating his living will.  He expressed desire to be DO NOT RESUSCITATE, but his living will apparently does not reflect this.  Gave him information and paperwork regarding this.  His wife asked me about his colonoscopy, as above.  I asked him if he would treat a colon cancer if it was found and both the patient and his wife stated that they would not and that they would choose palliative care.  If that is the case, then I agree with him that I would hold a colonoscopy.  -Talked to the patient's wife about the importance of respite care and gave her information about services that provide this.  -Refilled the patient's Azilect. 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  Will need to monitor.  If need in future, will try 1/2 of the tablet or even less.   3.  PDD  -continue exelon patch 9.5 mg daily  -on namenda 10 mg bid.  -home safety discussed in detail.  Shouldn't be left alone. 4.  Follow up in 3-4 months.  Much greater than 50% of this visit was spent in counseling with the patient and the family.  Total face to face time:  40 min

## 2015-11-14 NOTE — Patient Instructions (Signed)
1. Decrease Mirapex to 1/2 tablet in the morning for two weeks, then stop medication.  2. Prescription given for rolling walker. You can take this to any medical supply store.

## 2016-02-15 ENCOUNTER — Telehealth: Payer: Self-pay | Admitting: Neurology

## 2016-02-15 NOTE — Telephone Encounter (Signed)
-----   Message from [name], DO sent at 02/15/2016  1:20 PM EDT ----- Can we move him off botox day if he isn't a botox patient (or maybe there is a reason he is there???)

## 2016-02-15 NOTE — Telephone Encounter (Signed)
Left message on machine for patient to let him know I rescheduled his appt and to call if its not a good date/time.

## 2016-03-11 IMAGING — CT CT HEAD WO/W CM
1 of 2 series · 13 of 30 positions shown, 17 images · IV contrast (75CC OMNI 300)
Comparison: None.

CLINICAL DATA: Parkinson's disease.  Falling.  Extremity weakness.

EXAM:
CT HEAD WITHOUT AND WITH CONTRAST
TECHNIQUE: Contiguous axial images were obtained from the base of the skull
through the vertex without and with intravenous contrast
CONTRAST:  75mL OMNIPAQUE IOHEXOL 300 MG/ML  SOLN

[Series 32: 3d filtered head w/o · axial · non-contrast · 0.49mm/px · z∈[+17,+140]mm · 13 of 28 slices shown, 17 images]
[im 2/28  brain]
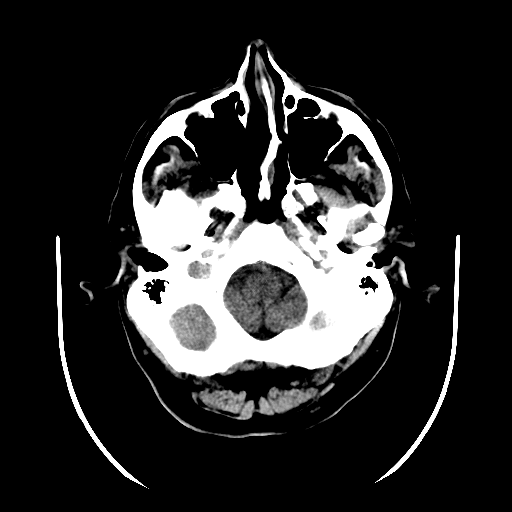
[im 2/28  bone]
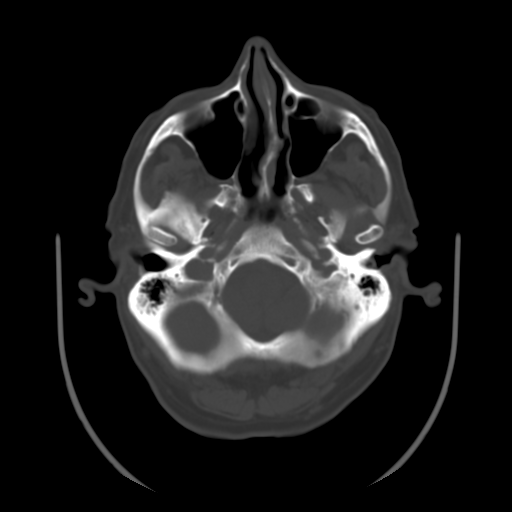
[im 4/28  brain]
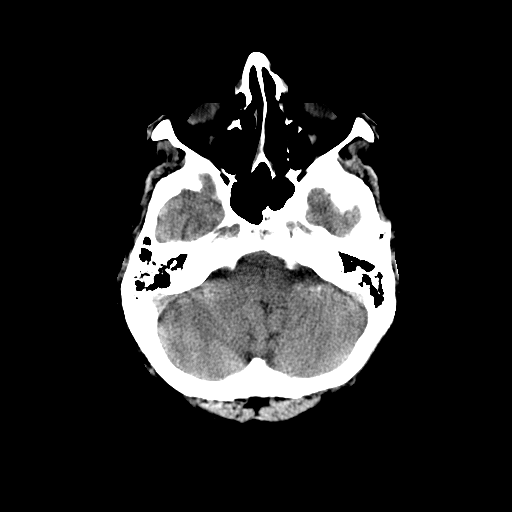
[im 6/28  brain]
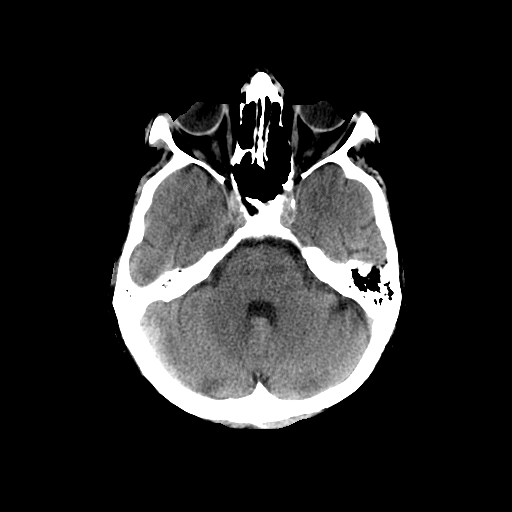
[im 8/28  brain]
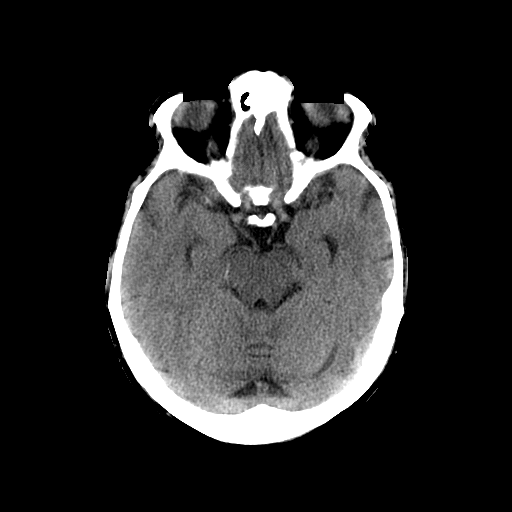
[im 10/28  brain]
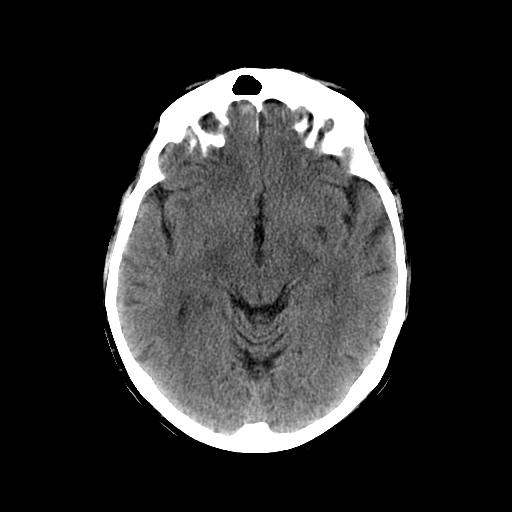
[im 10/28  bone]
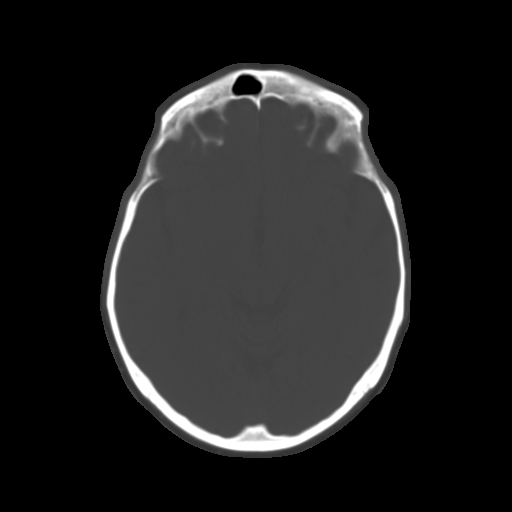
[im 12/28  brain]
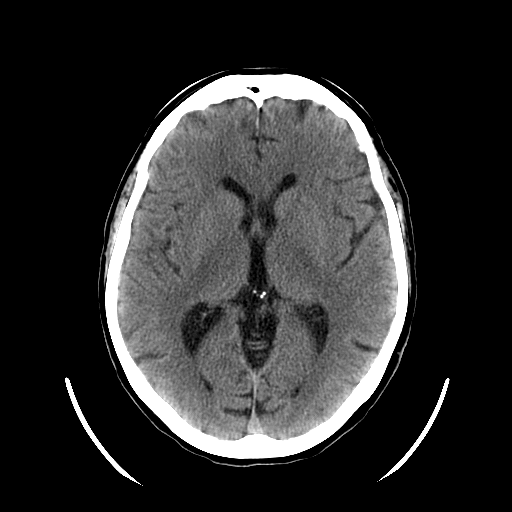
[im 14/28  brain]
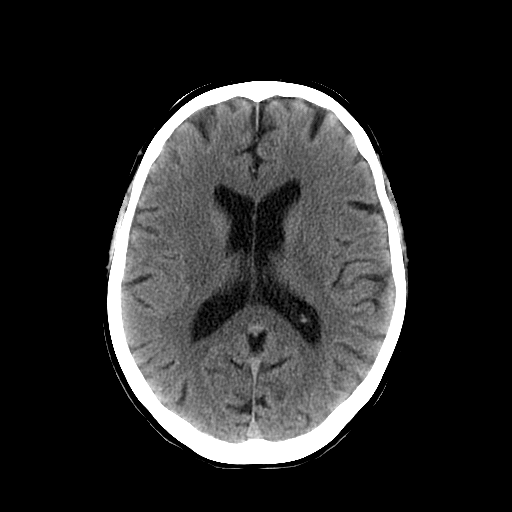
[im 16/28  brain]
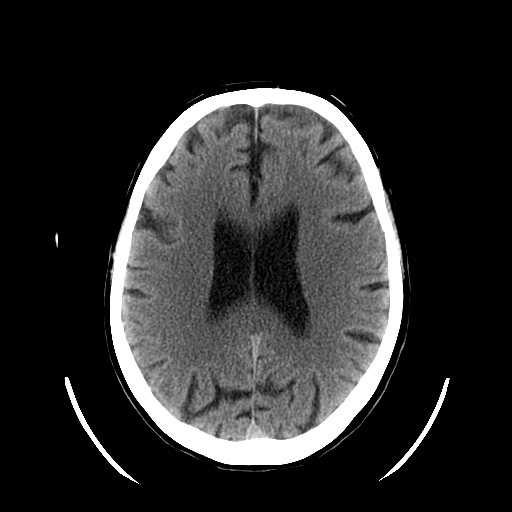
[im 18/28  brain]
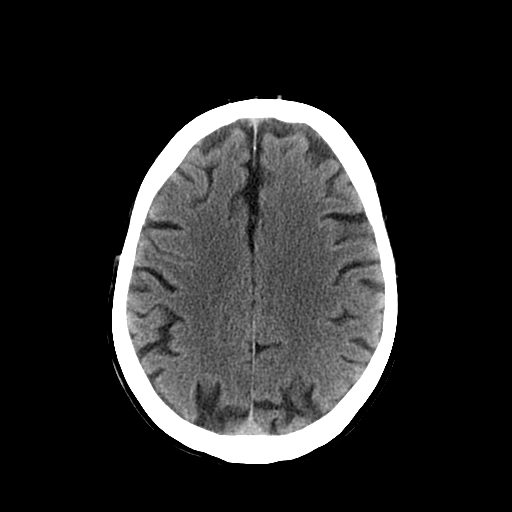
[im 18/28  bone]
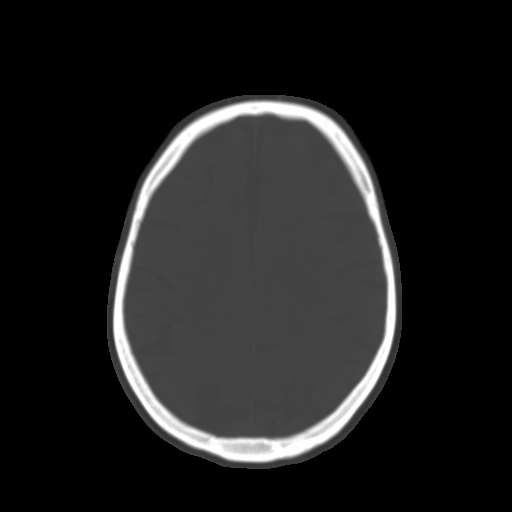
[im 20/28  brain]
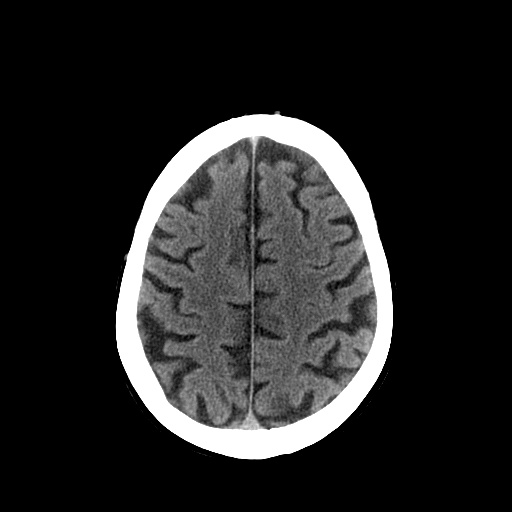
[im 22/28  brain]
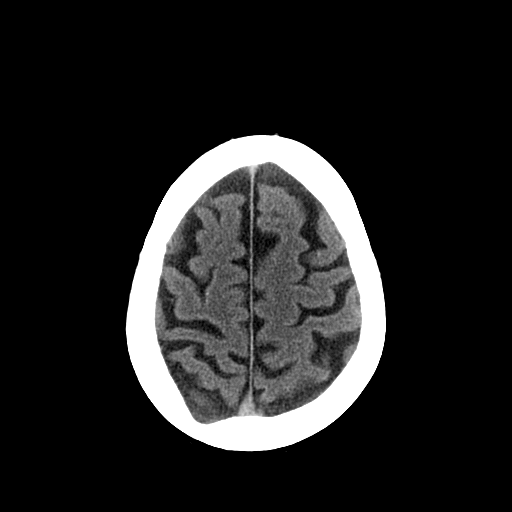
[im 24/28  brain]
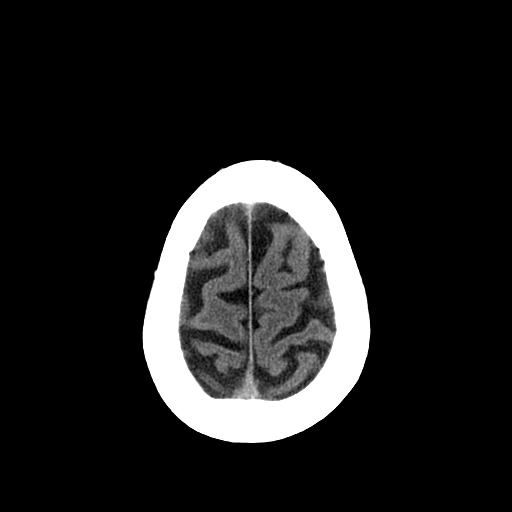
[im 26/28  brain]
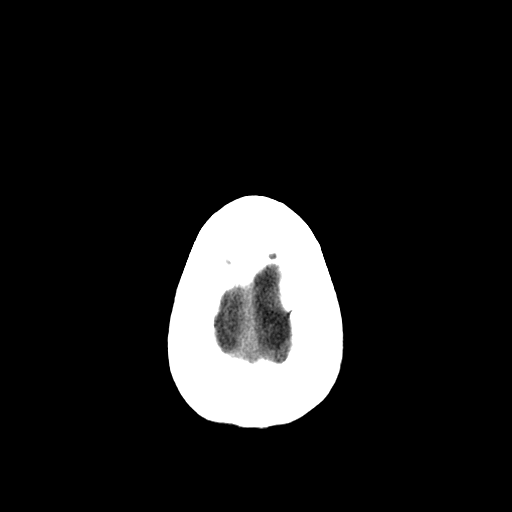
[im 26/28  bone]
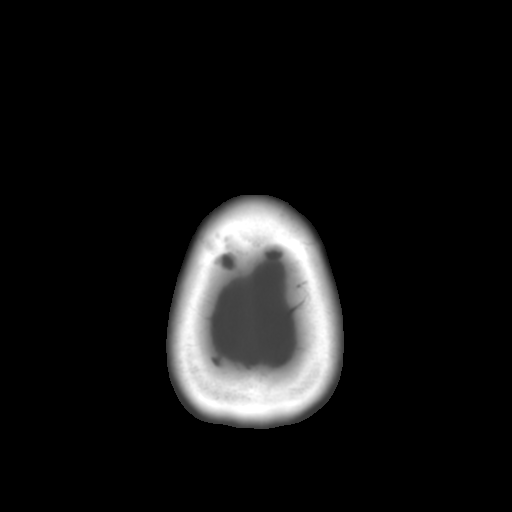

[13 of 30 positions shown; findings below may reference images not displayed]

FINDINGS: There is mild generalized atrophy. No evidence of old or acute focal
infarction, mass lesion, hemorrhage, hydrocephalus or extra-axial
collection. No abnormal enhancement. Brainstem appears within normal
limits by CT. No calvarial abnormality. No inflammatory sinus
disease. There is atherosclerotic calcification of the major vessels
at the base of the brain.
IMPRESSION: Atrophy.  No focal abnormality.

## 2016-03-13 ENCOUNTER — Ambulatory Visit: Payer: Medicare Other | Admitting: Neurology

## 2016-03-14 ENCOUNTER — Ambulatory Visit: Payer: Medicare Other | Admitting: Neurology

## 2016-03-19 ENCOUNTER — Ambulatory Visit (INDEPENDENT_AMBULATORY_CARE_PROVIDER_SITE_OTHER): Payer: Medicare Other | Admitting: Neurology

## 2016-03-19 ENCOUNTER — Encounter: Payer: Self-pay | Admitting: Neurology

## 2016-03-19 VITALS — BP 112/70 | HR 62

## 2016-03-19 DIAGNOSIS — G249 Dystonia, unspecified: Secondary | ICD-10-CM | POA: Diagnosis not present

## 2016-03-19 DIAGNOSIS — G4752 REM sleep behavior disorder: Secondary | ICD-10-CM

## 2016-03-19 DIAGNOSIS — Z79899 Other long term (current) drug therapy: Secondary | ICD-10-CM | POA: Diagnosis not present

## 2016-03-19 DIAGNOSIS — G20 Parkinson's disease: Secondary | ICD-10-CM

## 2016-03-19 DIAGNOSIS — F0280 Dementia in other diseases classified elsewhere without behavioral disturbance: Secondary | ICD-10-CM

## 2016-03-19 MED ORDER — PIMAVANSERIN TARTRATE 17 MG PO TABS: 2.0000 | ORAL_TABLET | Freq: Every day | ORAL | Status: DC

## 2016-03-19 MED ORDER — AMBULATORY NON FORMULARY MEDICATION: Status: DC

## 2016-03-19 NOTE — Progress Notes (Signed)
Manuel Jackson was seen today in the movement disorders clinic for neurologic consultation at the request of [name].  His PCP is Manuel Huh, MD.  The patient is seen today in neurologic consultation, accompanied by his wife who supplements the history.  He has been taken care of for many years by [name], but the drive to [facility] has become long and cumbersome for him and he is trying to find a neurologist closer to home.  I have reviewed and appreciated the records from [name].  He first saw [name] and was diagnosed with Parkinson's disease in approximately 2008, when [name] was with [facility] neurology.  He followed [name] to [facility].  The patient's first symptom was right hand rest tremor.  He was started on Azilect first and Mirapex came not long thereafter.  He has been on levodopa since approximately 2011.  The patient felt that each of these medications helped either tremor or balance or both at the initiation of the medication, but is having more difficulty now with freezing and on/off.  He developed hallucinations in approximately May, 2013 and did not fully respond to quetiapine.  He saw [name] for a one-time consultation in 2013 and [name] decided to taper him off of Mirapex.  Unfortunately, the patient became very stiff off of Mirapex and he ended up going back on the drug and is now back on it at pramipexole, 0.25 mg 4 times per day.  He is also on the Exelon patch 9.5 mg daily, Namenda 10 mg twice a day as well as his Seroquel 25 mg bid; he is still having hallucinations despite this.  As above, he has had more difficulties with motor fluctuations and called [name] in June and his dose of carbidopa/levodopa was adjusted slightly so that he is now taking 2 tablets at 8 AM/2 at noon/1 tablet at 2 PM/1 tablet at 4 PM/2 tablets at 6:30 PM and one tablet at bedtime.  Pt states that his medication works right after he takes the medication but by 11 am he  is frozen.  He states that medication wears out after 3 hours.  His wife states that she has been taking the 2 tablets at noon and starting giving one at 10 am and one at noon and that works better.  Interestingly, he wakes up at 6 am but doesn't take first med until 8am.     09/28/15 update:  The patient is following up today, accompanied by his wife who supplements the history.  He has a history of Parkinson's disease with Parkinson's disease dementia.  He is on carbidopa/levodopa 25/100, 2 tablets in the morning and 7 other tablets spread evenly throughout the day.  Last visit we added carbidopa/levodopa 50/200 at nighttime.  Despite attempts in the past by his previous physicians to get him off of Mirapex, he ended up going back on it and is on Mirapex, 0.25 mg 4 times per day.  He is on Seroquel 25 mg twice a day and last visit we discussed adding Nuplazid.  We also discussed the possibilities of duopa because of motor fluctuations.  He is on Exelon patch, 9.5 mg daily and Namenda, 10 mg twice a day.  He continues to have hallucinations.  He has had a few falls since last visit and the days that he falls are the days that he has not slept the night before.  He still isn't sleeping well.  He refuses to  use a walker or ambulatory assistive device.  He has a pressure sore.  His wife states that she thinks that he got it from when she had to pull him by his heels across the floor to move him when he was frozen.  She states that it is almost healed now.  He had graduated from PT but has restarted it yesterday.    11/14/15 update:  The patient has a history of Parkinson's disease with Parkinson's disease dementia.  He is on carbidopa/levodopa 25/100, 2 tablets in the morning, followed by 7 other dosages spread evenly throughout the day.  He is also on carbidopa/levodopa 50/200 at nighttime.  His wife will occasionally give him an extra levodopa (she did today because she knew she was coming here and he would  freeze).  Last visit, his quetiapine was changed from 25 mg twice a day to 50 mg at night.  This was primarily because of issues with insomnia. He is now going to bed at 8:30 pm and staying in bed without awakening until 7am.   We did, however, last visit talk some about Nuplazid.  This is because of hallucinations.  Also because of this, I dropped his pramipexole from 0.25 mg 4 times a day to 0.25 mg twice per day. Wife states that this is much better.  Rarely having hallucinations now.  Did not seem to have any bad consequences from dropping the pramipexole to a lower dosage.  I have been doing this cautiously because he was unable to get off of that in the past.  He does have a history of Parkinson's disease dementia and is on the Exelon patch as well as Namenda, 10 mg twice a day.  His wife brings me a picture of his pressure wound on his right buttocks and reports he is still doing wound care but it is getting better.  He is exercising on his cardiofit and has gone back to the [employer].  He fell twice since last visit; one time fell forward out of the chair while eating.  He didn't get hurt.  With the other, he was in the bathroom and didn't have his walker.  He was hanging his towel up.  Wife asks me about him doing a colonoscopy.  States that his primary care physician would like him to do that.  She is reluctant.  03/19/16 update:  The patient has a history of Parkinson's disease with Parkinson's disease dementia.  This patient is accompanied in the office by his spouse who supplements the history.  He is on carbidopa/levodopa 25/100, 2 tablets in the morning, followed by 7 other dosages spread evenly throughout the day.  He is also on carbidopa/levodopa 50/200 at nighttime.  I tried to wean him off of the mirapex but his wife states that he "couldn't do it" and he is only on 0.25 mg bid.    He remains on azilect.  He is on quetiapine 50 mg at night.  This was primarily because of issues with insomnia, which he  is still having.  We did, however, last visit talk some about Nuplazid for hallucinations but have held on that thus far.  He states that hallucinations have gotten a bit worse.  Wife states that was really bad when she was giving him tylenol PM but she stopped that and is giving melatonin for insomnia now.   He does have a history of Parkinson's disease dementia and is on the Exelon patch as well as Namenda, 10 mg  twice a day.  He is exercising several times a week at the [facility], doing biking.  Has had multiple falls.  Multiple episodes of freezing.  However, doesn't like to use the walker at home.    PREVIOUS MEDICATIONS: Sinemet, Mirapex and Seroquel; he tried klonopin - 0.5 mg - full tablet at bedtime but seemed to "make worse"  ALLERGIES:   Allergies  Allergen Reactions  . Naproxen Swelling    CURRENT MEDICATIONS:  Outpatient Encounter Prescriptions as of 03/19/2016  Medication Sig  . AZILECT 1 MG TABS tablet Take 1 tablet (1 mg total) by mouth daily.  . carbidopa-levodopa (SINEMET CR) 50-200 MG tablet TAKE 1 TABLET BY MOUTH AT BEDTIME.  . carbidopa-levodopa (SINEMET IR) 25-100 MG tablet 2 tablets at 8 am, 2 at 11 am, 2 at 3 pm, 2 at 6 pm  . cholecalciferol (VITAMIN D) 1000 UNITS tablet Take 1,000 Units by mouth daily.  . hydrochlorothiazide (HYDRODIURIL) 25 MG tablet Take 25 mg by mouth daily.   . memantine (NAMENDA) 10 MG tablet Take 10 mg by mouth daily.   . pramipexole (MIRAPEX) 0.5 MG tablet Take 0.25 mg by mouth 2 (two) times daily. With 8 am Levodopa and 8 pm CR Levodopa  . QUEtiapine (SEROQUEL) 25 MG tablet Take 2 tablets (50 mg total) by mouth at bedtime.  . rivastigmine (EXELON) 9.5 mg/24hr Place 9.5 mg onto the skin daily.    No facility-administered encounter medications on file as of 03/19/2016.    PAST MEDICAL HISTORY:   Past Medical History  Diagnosis Date  . Parkinson's disease (HCC)   . Hernia of abdominal cavity     PAST SURGICAL HISTORY:   Past Surgical  History  Procedure Laterality Date  . Hernia repair      SOCIAL HISTORY:   Social History   Social History  . Marital Status: Married    Spouse Name: N/A  . Number of Children: N/A  . Years of Education: N/A   Occupational History  . Not on file.   Social History Main Topics  . Smoking status: Former Games developer  . Smokeless tobacco: Never Used  . Alcohol Use: 0.0 oz/week    0 Standard drinks or equivalent per week  . Drug Use: No  . Sexual Activity: Not on file   Other Topics Concern  . Not on file   Social History Narrative   Lives with wife in story home.  Has no children.  Retired Dietitian for [employer].  Education: associate's degree.    FAMILY HISTORY:   Family Status  Relation Status Death Age  . Mother Deceased   . Father Deceased   . Brother Deceased     ROS:  A complete 10 system review of systems was obtained and was unremarkable apart from what is mentioned above.  PHYSICAL EXAMINATION:    VITALS:   Filed Vitals:   03/19/16 1426  BP: 112/70  Pulse: 62    GEN:  The patient appears stated age and is in NAD. HEENT:  Normocephalic, atraumatic.  The mucous membranes are moist. The superficial temporal arteries are without ropiness or tenderness. CV:  RRR Lungs:  CTAB Neck/HEME:  There are no carotid bruits bilaterally.  Neurological examination:  Orientation: He scored an 8/30 on his MoCA previously.  The patient is alert and oriented x3 today.   Cranial nerves: There is good facial symmetry.  There is significant facial hypomimia.    The speech is fluent and clear. Soft palate rises  symmetrically and there is no tongue deviation. Hearing is intact to conversational tone. Sensation: Sensation is intact to light touch throughout. Motor: Strength is 5/5 in the bilateral upper and lower extremities.   Shoulder shrug is equal and symmetric.  There is no pronator drift.   Movement examination: Tone: There is normal tone in the bilateral upper  extremities.  The tone in the lower extremities is normal.  Abnormal movements: Rare tremor in the left thumb; he is mildly dyskinetic, particularly in the left leg Coordination:  There is mild decremation with RAM's, with all form of RAMS, including alternating supination and pronation of the forearm, hand opening and closing, finger taps, heel taps and toe taps bilaterally.  However, apraxia with all commands is a big issue when testing this.   Gait and Station: The patient has no difficulty arising out of his hard walker seat without hands. The patient's stride length is good today.   ASSESSMENT/PLAN:  1.  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  Only on 0.25 mg bid but insists it helps  -talked again about duopa.  Think he could be a good candidate.  Asked them to think about this option given significant on/off, but does look much better today.  Regardless, he is still having significant freezing according to his wife.  -Wrote a prescription for a lift chair  -Talked extensively about end-of-life issues.  Talked about updating his living will.  He expressed desire to be DO NOT RESUSCITATE, but his living will apparently does not reflect this.  Gave him information and paperwork regarding this.  Talked about this last visit and he hasn't done it yet.  His wife asked me about his colonoscopy, as above.  I asked him if he would treat a colon cancer if it was found and both the patient and his wife stated that they would not and that they would choose palliative care.  If that is the case, then I agree with him that I would hold a colonoscopy.  -Talked to the patient's wife about the importance of respite care and gave her  information about services that provide this.  -continue Azilect.  -refer to neurorehab center  -talked about levodopa NS and apomorphine SL that should be to the market soon. 2.  RBD  -has tried klonopin but thinks that he had SE (today states that he just thinks that it didn't work).  Will need to monitor.  If need in future, will try 1/2 of the tablet or even less.   3.  PDD  -continue exelon patch 9.5 mg daily  -on namenda 10 mg bid.  -home safety discussed in detail.  Shouldn't be left alone.  -add nuplazid after we check EKG to make sure that QT interval not prolonged, especially given that on seroquel.  Discussed black box warning in detail with pt/wife. 4.  Insomnia  -on 50 mg seroquel and melatonin 5.  Follow up in 3-4 months.  Much greater than 50% of this visit was spent in counseling with the patient and the family.  Total face to face time:  25 min

## 2016-03-19 NOTE — Patient Instructions (Addendum)
1. RX for lift chair given.  2. Order for EKG given. Please schedule at your primary care doctor's office. I will call when we get results. Do not start Nuplazid until you hear from us.  3. When you do start Nuplazid you will take 2 tablets once daily.  4. You have been referred to Neuro Rehab. They will call you directly to schedule an appointment.  Please call [phone] if you do not hear from them.  5. If you are interested in the driving assessment, you can contact [employer] [place]. [phone]. 6. Follow up 3 months.

## 2016-03-26 ENCOUNTER — Telehealth: Payer: Self-pay | Admitting: Neurology

## 2016-03-26 NOTE — Telephone Encounter (Signed)
Patient's wife made aware.

## 2016-03-26 NOTE — Telephone Encounter (Signed)
Let pt/wife know that PCP said QT interval was normal and okay to start nuplazid

## 2016-04-05 ENCOUNTER — Ambulatory Visit: Payer: Medicare Other | Admitting: Physical Therapy

## 2016-04-09 ENCOUNTER — Other Ambulatory Visit: Payer: Self-pay | Admitting: Neurology

## 2016-04-09 NOTE — Telephone Encounter (Signed)
Carbidopa Levodopa refill requested. Per last office note- patient to remain on medication. Refill approved and sent to patient's pharmacy.   

## 2016-04-23 ENCOUNTER — Ambulatory Visit: Payer: Medicare Other | Admitting: Physical Therapy

## 2016-04-26 ENCOUNTER — Telehealth: Payer: Self-pay | Admitting: Neurology

## 2016-04-26 NOTE — Telephone Encounter (Signed)
Check UA ([facility]  Can take couple extra IR levodopa if needed.  If freezing a lot, could eval for apokyn if he/she able to give shot

## 2016-04-26 NOTE — Telephone Encounter (Signed)
VM-PT's wife Tobi Bastosnna left a message in regards to PT not doing well on his medication/Dawn CB# [phone] or [phone]

## 2016-04-26 NOTE — Telephone Encounter (Signed)
Spoke with patient's wife and she states patient has been on Nuplazid about 3-4 weeks and was doing well until this past week. She states hallucinations are already noticeably better. This week, however, he is having lots of trouble with freezing. He was in the shower for two hours today because he could not move to get out. She had to give him an extra levodopa to get him out of the shower. He has also been slower and more confused. She states he has confusion normally with his dementia but now he is using nonsense words during conversations. I asked about recent infections, other new medications, or other symptoms. She states he does have some urinary urgency and he did develop a pressure ulcer around the same time. They are treating that will wound dressings but no medications.  Please advise.

## 2016-04-26 NOTE — Telephone Encounter (Signed)
Patient's wife made aware. She states [facility] since [place] in [place]. She will let us know results so we can discuss medications if no infection present.

## 2016-05-01 ENCOUNTER — Other Ambulatory Visit: Payer: Self-pay | Admitting: Neurology

## 2016-05-01 MED ORDER — QUETIAPINE FUMARATE 25 MG PO TABS: 50.0000 mg | ORAL_TABLET | Freq: Every day | ORAL | Status: DC

## 2016-05-01 NOTE — Telephone Encounter (Signed)
Seroquel refill requested. Per last office note- patient to remain on medication. Refill approved and sent to patient's pharmacy.   

## 2016-05-28 ENCOUNTER — Telehealth: Payer: Self-pay | Admitting: Neurology

## 2016-05-28 MED ORDER — AZILECT 1 MG PO TABS: 1.0000 mg | ORAL_TABLET | Freq: Every day | ORAL | 5 refills | Status: DC

## 2016-05-28 NOTE — Telephone Encounter (Signed)
Prescription sent to pharmacy.

## 2016-06-19 NOTE — Progress Notes (Addendum)
Manuel Jackson was seen today in the movement disorders clinic for neurologic consultation at the request of [name].  His PCP is [name], MD.  The patient is seen today in neurologic consultation, accompanied by his wife who supplements the history.  He has been taken care of for many years by [name], but the drive to [facility] has become long and cumbersome for him and he is trying to find a neurologist closer to home.  I have reviewed and appreciated the records from [name].  He first saw [name] and was diagnosed with Parkinson's disease in approximately 2008, when [name] was with [facility] neurology.  He followed [name] to [facility].  The patient's first symptom was right hand rest tremor.  He was started on Azilect first and Mirapex came not long thereafter.  He has been on levodopa since approximately 2011.  The patient felt that each of these medications helped either tremor or balance or both at the initiation of the medication, but is having more difficulty now with freezing and on/off.  He developed hallucinations in approximately May, 2013 and did not fully respond to quetiapine.  He saw [name] for a one-time consultation in 2013 and [name] decided to taper him off of Mirapex.  Unfortunately, the patient became very stiff off of Mirapex and he ended up going back on the drug and is now back on it at pramipexole, 0.25 mg 4 times per day.  He is also on the Exelon patch 9.5 mg daily, Namenda 10 mg twice a day as well as his Seroquel 25 mg bid; he is still having hallucinations despite this.  As above, he has had more difficulties with motor fluctuations and called [name] in June and his dose of carbidopa/levodopa was adjusted slightly so that he is now taking 2 tablets at 8 AM/2 at noon/1 tablet at 2 PM/1 tablet at 4 PM/2 tablets at 6:30 PM and one tablet at bedtime.  Pt states that his medication works right after he takes the medication but by 11 am he  is frozen.  He states that medication wears out after 3 hours.  His wife states that she has been taking the 2 tablets at noon and starting giving one at 10 am and one at noon and that works better.  Interestingly, he wakes up at 6 am but doesn't take first med until 8am.     09/28/15 update:  The patient is following up today, accompanied by his wife who supplements the history.  He has a history of Parkinson's disease with Parkinson's disease dementia.  He is on carbidopa/levodopa 25/100, 2 tablets in the morning and 7 other tablets spread evenly throughout the day.  Last visit we added carbidopa/levodopa 50/200 at nighttime.  Despite attempts in the past by his previous physicians to get him off of Mirapex, he ended up going back on it and is on Mirapex, 0.25 mg 4 times per day.  He is on Seroquel 25 mg twice a day and last visit we discussed adding Nuplazid.  We also discussed the possibilities of duopa because of motor fluctuations.  He is on Exelon patch, 9.5 mg daily and Namenda, 10 mg twice a day.  He continues to have hallucinations.  He has had a few falls since last visit and the days that he falls are the days that he has not slept the night before.  He still isn't sleeping well.  He refuses to  use a walker or ambulatory assistive device.  He has a pressure sore.  His wife states that she thinks that he got it from when she had to pull him by his heels across the floor to move him when he was frozen.  She states that it is almost healed now.  He had graduated from PT but has restarted it yesterday.    11/14/15 update:  The patient has a history of Parkinson's disease with Parkinson's disease dementia.  He is on carbidopa/levodopa 25/100, 2 tablets in the morning, followed by 7 other dosages spread evenly throughout the day.  He is also on carbidopa/levodopa 50/200 at nighttime.  His wife will occasionally give him an extra levodopa (she did today because she knew she was coming here and he would  freeze).  Last visit, his quetiapine was changed from 25 mg twice a day to 50 mg at night.  This was primarily because of issues with insomnia. He is now going to bed at 8:30 pm and staying in bed without awakening until 7am.   We did, however, last visit talk some about Nuplazid.  This is because of hallucinations.  Also because of this, I dropped his pramipexole from 0.25 mg 4 times a day to 0.25 mg twice per day. Wife states that this is much better.  Rarely having hallucinations now.  Did not seem to have any bad consequences from dropping the pramipexole to a lower dosage.  I have been doing this cautiously because he was unable to get off of that in the past.  He does have a history of Parkinson's disease dementia and is on the Exelon patch as well as Namenda, 10 mg twice a day.  His wife brings me a picture of his pressure wound on his right buttocks and reports he is still doing wound care but it is getting better.  He is exercising on his cardiofit and has gone back to the [employer].  He fell twice since last visit; one time fell forward out of the chair while eating.  He didn't get hurt.  With the other, he was in the bathroom and didn't have his walker.  He was hanging his towel up.  Wife asks me about him doing a colonoscopy.  States that his primary care physician would like him to do that.  She is reluctant.  03/19/16 update:  The patient has a history of Parkinson's disease with Parkinson's disease dementia.  This patient is accompanied in the office by his spouse who supplements the history.  He is on carbidopa/levodopa 25/100, 2 tablets in the morning, followed by 7 other dosages spread evenly throughout the day.  He is also on carbidopa/levodopa 50/200 at nighttime.  I tried to wean him off of the mirapex but his wife states that he "couldn't do it" and he is only on 0.25 mg bid.    He remains on azilect.  He is on quetiapine 50 mg at night.  This was primarily because of issues with insomnia, which he  is still having.  We did, however, last visit talk some about Nuplazid for hallucinations but have held on that thus far.  He states that hallucinations have gotten a bit worse.  Wife states that was really bad when she was giving him tylenol PM but she stopped that and is giving melatonin for insomnia now.   He does have a history of Parkinson's disease dementia and is on the Exelon patch as well as Namenda, 10 mg  twice a day.  He is exercising several times a week at the [facility], doing biking.  Has had multiple falls.  Multiple episodes of freezing.  However, doesn't like to use the walker at home.    06/20/16 update:  The patient has a history of Parkinson's disease with Parkinson's disease dementia.  This patient is accompanied in the office by his spouse who supplements the history.  He is on carbidopa/levodopa 25/100, 2 tablets in the morning, followed by 7 other dosages spread evenly throughout the day.  He is also on carbidopa/levodopa 50/200 at nighttime.  He is also still on low-dose pramipexole at 0.25 mg twice a day.  Despite trying to wean this off in the past, he was not able to get off of it and went back on it.  He is on Azilect, 1 mg daily.  He has Parkinson's disease dementia and is on Exelon Patch and Namenda.  Last visit, after checking his QT interval which was normal, we added Nuplazid for hallucinations.  He did well after a couple of weeks, and then began to have some freezing and his wife thought that this was medication related.  However, he also had a pressure ulcer and urinary urgency at the time.  I urged him to get checked by primary care before we went off of the medication.  They did not call back since that time.  He is also on very low-dose Seroquel for insomnia.  Pt and wife are not sure if the nuplazid it is helpful but think that maybe it has be (but really aren't sure).  2 episodes of freezing so much that he had to go to the ER in [place].  State that the ER did not  help them but they weren't sure what else to do.  Has at least 4 hours of off time per day if not more.  PREVIOUS MEDICATIONS: Sinemet, Mirapex and Seroquel; he tried klonopin - 0.5 mg - full tablet at bedtime but seemed to "make worse"  ALLERGIES:   Allergies  Allergen Reactions  . Naproxen Swelling    CURRENT MEDICATIONS:  Outpatient Encounter Prescriptions as of 06/20/2016  Medication Sig  . AZILECT 1 MG TABS tablet Take 1 tablet (1 mg total) by mouth daily.  . carbidopa-levodopa (SINEMET CR) 50-200 MG tablet TAKE 1 TABLET BY MOUTH AT BEDTIME.  . carbidopa-levodopa (SINEMET IR) 25-100 MG tablet 2 tablets at 8 am, 2 at 11 am, 2 at 3 pm, 2 at 6 pm  . cholecalciferol (VITAMIN D) 1000 UNITS tablet Take 1,000 Units by mouth daily.  . hydrochlorothiazide (HYDRODIURIL) 25 MG tablet Take 25 mg by mouth daily.   . memantine (NAMENDA) 10 MG tablet Take 10 mg by mouth daily.   Marland Kitchen Pimavanserin Tartrate (NUPLAZID) 17 MG TABS Take 2 tablets by mouth daily.  . pramipexole (MIRAPEX) 0.5 MG tablet Take 0.25 mg by mouth 2 (two) times daily. With 8 am Levodopa and 8 pm CR Levodopa  . QUEtiapine (SEROQUEL) 25 MG tablet Take 2 tablets (50 mg total) by mouth at bedtime.  . rivastigmine (EXELON) 9.5 mg/24hr Place 9.5 mg onto the skin daily.   . [DISCONTINUED] AMBULATORY NON FORMULARY MEDICATION Lift chair   No facility-administered encounter medications on file as of 06/20/2016.     PAST MEDICAL HISTORY:   Past Medical History:  Diagnosis Date  . Hernia of abdominal cavity   . Parkinson's disease ([place])     PAST SURGICAL HISTORY:   Past Surgical History:  Procedure Laterality Date  . HERNIA REPAIR      SOCIAL HISTORY:   Social History   Social History  . Marital status: Married    Spouse name: N/A  . Number of children: N/A  . Years of education: N/A   Occupational History  . Not on file.   Social History Main Topics  . Smoking status: Former Research scientist (life sciences)  . Smokeless tobacco: Never Used  .  Alcohol use 0.0 oz/week  . Drug use: No  . Sexual activity: Not on file   Other Topics Concern  . Not on file   Social History Narrative   Lives with wife in story home.  Has no children.  Retired Geophysical data processor for [employer]  Education: associate's degree.    FAMILY HISTORY:   Family Status  Relation Status  . Mother Deceased  . Father Deceased  . Brother Deceased    ROS:  A complete 10 system review of systems was obtained and was unremarkable apart from what is mentioned above.  PHYSICAL EXAMINATION:    VITALS:   Vitals:   06/20/16 1409  BP: 110/64  Pulse: 64  Weight: 153 lb (69.4 kg)  Height: '5\' 7"'$  (1.702 m)    GEN:  The patient appears stated age and is in NAD. HEENT:  Normocephalic, atraumatic.  The mucous membranes are moist. The superficial temporal arteries are without ropiness or tenderness. CV:  RRR Lungs:  CTAB Neck/HEME:  There are no carotid bruits bilaterally.  Neurological examination:  Orientation: He scored an 8/30 on his MoCA previously.  The patient is alert and oriented x2 today.   Cranial nerves: There is good facial symmetry.  There is significant facial hypomimia.    The speech is fluent and clear. Soft palate rises symmetrically and there is no tongue deviation. Hearing is intact to conversational tone. Sensation: Sensation is intact to light touch throughout. Motor: Strength is 5/5 in the bilateral upper and lower extremities.   Shoulder shrug is equal and symmetric.  There is no pronator drift.   Movement examination: Tone: There is normal tone in the bilateral upper extremities.  The tone in the lower extremities is normal.  Abnormal movements: Rare tremor in the left thumb; he is not dyskinetic today Coordination:  There is mild decremation with RAM's, with all form of RAMS, including alternating supination and pronation of the forearm, hand opening and closing, finger taps, heel taps and toe taps bilaterally.  However, apraxia with all  commands is a big issue when testing this.   Gait and Station: The patient has no difficulty arising out of his hard walker seat without hands. The patient's stride length is good today.   ASSESSMENT/PLAN:  1.  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  Only on 0.25 mg bid but insists it helps  -talked again about duopa.  He is having a lot of freezing and is not an apokyn candidate because of hallucinations.  Not a DBS candidate because of dementia.  Showed pt/wife again duopa device and this time patient expresses interest.  -Talked extensively about end-of-life issues.  Talked about updating his living will.  He expressed desire to be DO NOT RESUSCITATE, but his living will apparently does not reflect  this.  Gave him information and paperwork regarding this.  Talked about this last visit and he hasn't done it yet.  His wife asked me about his colonoscopy, as above.  I asked him if he would treat a colon cancer if it was found and both the patient and his wife stated that they would not and that they would choose palliative care.  If that is the case, then I agree with him that I would hold a colonoscopy.  -Talked to the patient's wife about the importance of respite care and gave her information about services that provide this.  -continue Azilect.  -RX transport chair  -met social worker today 2.  RBD  -has tried klonopin but thinks that he had SE (today states that he just thinks that it didn't work).  Will need to monitor.  If need in future, will try 1/2 of the tablet or even less.   3.  PDD  -continue exelon patch 9.5 mg daily  -on namenda 10 mg bid.  RF today  -home safety discussed in detail.  Shouldn't be left  alone.  -Talked about whether or not to continue the Nuplazid, and the patient and his wife would like to continue that..  Discussed black box warning in detail with pt/wife. 4.  Insomnia  -on 50 mg seroquel and melatonin  -Talked to them about the interaction between Nuplazid and Seroquel.  Talked about the fact that they are both atypical antipsychotic medications.  Both of these have a black box warning.  Discussed with the blackbox warning was in detail with the patient and his wife.  He does not have prolonged QT.  The patient and his wife decided that the benefits outweigh the risks.  Continue both for now 5.  Follow up in 3-4 months.  Much greater than 50% of this visit was spent in counseling with the patient and the family.  Total face to face time:  40 min

## 2016-06-20 ENCOUNTER — Encounter: Payer: Self-pay | Admitting: Neurology

## 2016-06-20 ENCOUNTER — Ambulatory Visit (INDEPENDENT_AMBULATORY_CARE_PROVIDER_SITE_OTHER): Payer: Medicare Other | Admitting: Neurology

## 2016-06-20 VITALS — BP 110/64 | HR 64 | Ht 67.0 in | Wt 153.0 lb

## 2016-06-20 DIAGNOSIS — G20 Parkinson's disease: Secondary | ICD-10-CM | POA: Diagnosis not present

## 2016-06-20 DIAGNOSIS — G4700 Insomnia, unspecified: Secondary | ICD-10-CM

## 2016-06-20 DIAGNOSIS — F0280 Dementia in other diseases classified elsewhere without behavioral disturbance: Secondary | ICD-10-CM | POA: Diagnosis not present

## 2016-06-20 DIAGNOSIS — R441 Visual hallucinations: Secondary | ICD-10-CM | POA: Diagnosis not present

## 2016-06-20 MED ORDER — CARBIDOPA-LEVODOPA 25-100 MG PO TABS: ORAL_TABLET | ORAL | 5 refills | Status: DC

## 2016-06-20 MED ORDER — RIVASTIGMINE 9.5 MG/24HR TD PT24: 9.5000 mg | MEDICATED_PATCH | Freq: Every day | TRANSDERMAL | 5 refills | Status: DC

## 2016-06-20 MED ORDER — MEMANTINE HCL 10 MG PO TABS: 10.0000 mg | ORAL_TABLET | Freq: Every day | ORAL | 5 refills | Status: DC

## 2016-06-20 MED ORDER — PRAMIPEXOLE DIHYDROCHLORIDE 0.5 MG PO TABS: 0.2500 mg | ORAL_TABLET | Freq: Two times a day (BID) | ORAL | 5 refills | Status: DC

## 2016-06-20 NOTE — Progress Notes (Signed)
Clinical Social Work Note  CSW met with pt and pt wife during follow up visit today at the request of Dr.Tat. CSW introduced self and explained role. 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 CSW provided emotional support as pt spouse discussed feelings surrounding caring for pt with diagnosis of Parkinson's disease with Parkinson's disease dementia. Pt wife discussed that she is eager to get a transport chair because there are times when pt has difficulty with ambulating due to significant freezing. Pt and pt wife reflected with CSW on their conversation with Dr. Carles Collet about duopa and educational materials provided. Pt discussed that he does not drive, but working with the [facility] about his license. Discussed OT driving evaluations and pt wife confirmed that she had contact information for OT driving evaluations if needed. Pt wife shared that she is unsure why pt is concerned about driver's license as pt does not drive and they sold his car. CSW discussed that there may be some association with the driver's license being a form of ID and may want to inquire with the DMV about an ID card for pt to have. CSW provided pt and pt wife with CSW contact information and they have agreed to contact me if I can be of further assistance with any other social work services.  Manuel Jackson, MSW, LCSW Clinical Social Worker Movement [facility] [phone]

## 2016-06-21 ENCOUNTER — Telehealth: Payer: Self-pay | Admitting: Neurology

## 2016-06-21 MED ORDER — TRANSPORT CHAIR MISC: 1.0000 | Freq: Every day | 0 refills | Status: DC

## 2016-06-21 NOTE — Telephone Encounter (Signed)
-----   Message from [name], DO sent at 06/20/2016  7:32 PM EDT ----- Send RX for transport chair.  Wife asked me for it but I forgot.  Thx

## 2016-06-21 NOTE — Telephone Encounter (Signed)
Order entered and mailed to patient.

## 2016-06-28 ENCOUNTER — Telehealth: Payer: Self-pay | Admitting: Neurology

## 2016-06-28 NOTE — Telephone Encounter (Signed)
Called pharmacy- patient is already getting generic azilect.   Patient's wife made aware.

## 2016-06-28 NOTE — Telephone Encounter (Signed)
Manuel RipperWilliam Jackson [birth year]. His # is [phone] [id number].His wife Jeb Leveringnna Deakins called regarding the medication Azilect. The pharmacy said  he could get the Generic easier. He   Needs to get Dr. Arbutus Leasat 's approval. Thank you

## 2016-07-04 ENCOUNTER — Telehealth: Payer: Self-pay | Admitting: Neurology

## 2016-07-04 NOTE — Telephone Encounter (Signed)
Received call from Thora LanceEHINGER,ROBERT R, MD and appreciate input.  Feels that pt anxious/depressed but also c/o polypharmacy.  Asked me if he could start SSRI, and told him that is okay as long as he checked QT interval given on seroquel and nuplazid now.  Told him we could probably d/c azilect to help with polypharmacy.  Jade, let pts wife know to hold azilect

## 2016-07-04 NOTE — Telephone Encounter (Signed)
Patient's wife made aware for patient to stop Azilect.

## 2016-07-09 ENCOUNTER — Other Ambulatory Visit: Payer: Self-pay | Admitting: Neurology

## 2016-07-09 DIAGNOSIS — G20 Parkinson's disease: Secondary | ICD-10-CM

## 2016-07-12 ENCOUNTER — Telehealth: Payer: Self-pay | Admitting: Neurology

## 2016-07-12 NOTE — Telephone Encounter (Signed)
Notes faxed to Duopa with confirmation received.

## 2016-07-12 NOTE — Telephone Encounter (Signed)
Alan RipperWilliam Gorton 2046-01-25. Thayer OhmChris called regarding having office notes faxed to [phone] regarding his therapy (Parkinson's). Thayer OhmChris said it was needing to be faxed for the specialty pharmacy.  Chris's # is [phone]. Thank you

## 2016-07-19 ENCOUNTER — Telehealth: Payer: Self-pay | Admitting: Neurology

## 2016-07-19 NOTE — Telephone Encounter (Signed)
RX initialed, dated, and faxed back.

## 2016-07-19 NOTE — Telephone Encounter (Signed)
[facility] called on Manuel RipperWilliam Demir 2046/03/04. They need the prescription to be initialed and dated and then faxed back to [phone]. Thank you

## 2016-07-31 ENCOUNTER — Telehealth: Payer: Self-pay | Admitting: Neurology

## 2016-07-31 NOTE — Telephone Encounter (Signed)
Spoke with patient's wife. Made aware consult [facility] at 1:30 pm to arrive at 1 pm for Duopa consultation. Medication approved by insurance. They will let us know after consult if they want to proceed with pump.

## 2016-08-20 ENCOUNTER — Ambulatory Visit
Admission: RE | Admit: 2016-08-20 | Discharge: 2016-08-20 | Disposition: A | Discharge status: Home / Self Care | Payer: Medicare Other | Source: Ambulatory Visit | Attending: Neurology | Admitting: Neurology

## 2016-08-20 DIAGNOSIS — G20 Parkinson's disease: Secondary | ICD-10-CM

## 2016-08-20 HISTORY — PX: IR GENERIC HISTORICAL: IMG1180011

## 2016-08-20 NOTE — Consult Note (Signed)
Chief Complaint: Patient was seen in consultation today for  Chief Complaint  Patient presents with  . Advice Only   at the request of Tat,Rebecca S  Referring Physician(s): Tat,Rebecca S  History of Present Illness: Manuel Jackson is a 70 y.o. male with Parkinson's disease and being evaluated for Duopa therapy. Patient is here to discuss placement of a percutaneous gastrojejunal tube for the Duopa therapy. Patient has not had any major abdominal surgeries other than hernia repair. Patient is not having any active GI problems or abdominal pain. Patient has no trouble swallowing or eating. No significant change in his appetite.  Past Medical History:  Diagnosis Date  . Hernia of abdominal cavity   . Parkinson's disease [facility](HCC)     Past Surgical History:  Procedure Laterality Date  . HERNIA REPAIR      Allergies: Naproxen  Medications: Prior to Admission medications   Medication Sig Start Date End Date Taking? Authorizing Provider  carbidopa-levodopa (SINEMET CR) 50-200 MG tablet TAKE 1 TABLET BY MOUTH AT BEDTIME. 04/09/16  Yes Rebecca S Tat, DO  carbidopa-levodopa (SINEMET IR) 25-100 MG tablet 2 tablets at 8 am, 2 at 11 am, 2 at 3 pm, 2 at 6 pm 06/20/16  Yes Rebecca S Tat, DO  cholecalciferol (VITAMIN D) 1000 UNITS tablet Take 1,000 Units by mouth daily.   Yes Historical Provider, MD  escitalopram (LEXAPRO) 10 MG tablet Take 10 mg by mouth daily with supper.   Yes Historical Provider, MD  hydrochlorothiazide (HYDRODIURIL) 25 MG tablet Take 25 mg by mouth daily.  11/01/10  Yes Historical Provider, MD  memantine (NAMENDA) 10 MG tablet Take 1 tablet (10 mg total) by mouth daily. 06/20/16  Yes Rebecca S Tat, DO  Pimavanserin Tartrate (NUPLAZID) 17 MG TABS Take 2 tablets by mouth daily. 03/19/16  Yes Rebecca S Tat, DO  pramipexole (MIRAPEX) 0.5 MG tablet Take 0.5 tablets (0.25 mg total) by mouth 2 (two) times daily. With 8 am Levodopa and 8 pm CR Levodopa 06/20/16  Yes Rebecca S Tat,  DO  QUEtiapine (SEROQUEL) 25 MG tablet Take 2 tablets (50 mg total) by mouth at bedtime. 05/01/16  Yes Rebecca S Tat, DO  rivastigmine (EXELON) 9.5 mg/24hr Place 1 patch (9.5 mg total) onto the skin daily. 06/20/16  Yes Rebecca S Tat, DO  AZILECT 1 MG TABS tablet Take 1 tablet (1 mg total) by mouth daily. Patient not taking: Reported on 08/20/2016 05/28/16   [name], DO  Misc. Devices (TRANSPORT CHAIR) MISC 1 Device by Does not apply route daily. Patient not taking: Reported on 08/20/2016 06/21/16   [name], DO     Family History  Problem Relation Age of Onset  . Cancer Mother   . Diabetes Brother   . Heart disease Father   . Diabetes Father     Social History   Social History  . Marital status: Married    Spouse name: N/A  . Number of children: N/A  . Years of education: N/A   Social History Main Topics  . Smoking status: Former Games developermoker  . Smokeless tobacco: Never Used  . Alcohol use 0.0 oz/week  . Drug use: No  . Sexual activity: Not on file   Other Topics Concern  . Not on file   Social History Narrative   Lives with wife in story home.  Has no children.  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  Education: associate's degree.       Review of Systems  Constitutional: Negative for activity change, appetite change and fatigue.  Respiratory: Negative.   Cardiovascular: Negative.   Gastrointestinal: Negative.   Genitourinary: Negative.     Vital Signs: BP 138/81 (BP Location: Left Arm, Patient Position: Sitting, Cuff Size: Normal)   Pulse 64   Resp 14   Ht 5\' 7"  (1.702 m)   Wt 150 lb (68 kg)   SpO2 100%   BMI 23.49 kg/m   Physical Exam  Cardiovascular: Normal rate, regular rhythm and normal heart sounds.   Pulmonary/Chest: Effort normal and breath sounds normal.  Abdominal: Soft. Bowel sounds are normal.    Imaging: No results found.  Labs:  CBC: No results for input(s): WBC, HGB, HCT, PLT in the last 8760 hours.  COAGS: No results for  input(s): INR, APTT in the last 8760 hours.  BMP: No results for input(s): NA, K, CL, CO2, GLUCOSE, BUN, CALCIUM, CREATININE, GFRNONAA, GFRAA in the last 8760 hours.  Invalid input(s): CMP  LIVER FUNCTION TESTS: No results for input(s): BILITOT, AST, ALT, ALKPHOS, PROT, ALBUMIN in the last 8760 hours.  TUMOR MARKERS: No results for input(s): AFPTM, CEA, CA199, CHROMGRNA in the last 8760 hours.  Assessment and Plan: 70 year old male with Parkinson's disease and being evaluated for placement of a gastrojejunal feeding tube for Duopa therapy. We discussed the percutaneous gastrojejunal feeding tube in depth. Explained to the patient that he would drink barium the night before in order to opacify the colon.  We will want him to continue his Parkinson's medications even the morning of the procedure. We discussed the procedure itself and the recovery. We discussed caring for the tube and possible complications which include leaking and infection at the tube site. I feel that the patient has a very good understanding of the procedure. Patient is a candidate for a gastrojejunal tube. We can schedule the patient for the tube placement at his convenience.    Thank you for this interesting consult.  I greatly enjoyed meeting SUMNER KIRCHMAN and look forward to participating in their care.  A copy of this report was sent to the requesting provider on this date.  Electronically Signed: Abundio Miu 08/20/2016, 4:22 PM   I spent a total of  15 Minutes   in face to face in clinical consultation, greater than 50% of which was counseling/coordinating care for placement of gastrojejunal tube.

## 2016-09-13 ENCOUNTER — Telehealth: Payer: Self-pay | Admitting: Neurology

## 2016-09-13 NOTE — Telephone Encounter (Signed)
Left message on machine for patient to call back.   To see where he is in his thoughts on Duopa. Awaiting call back.

## 2016-09-17 ENCOUNTER — Encounter: Payer: Self-pay | Admitting: Diagnostic Radiology

## 2016-09-18 ENCOUNTER — Telehealth: Payer: Self-pay | Admitting: Neurology

## 2016-09-18 NOTE — Telephone Encounter (Signed)
Alan RipperWilliam Retherford 2046/04/27. She was returning your call. Thank you

## 2016-09-22 NOTE — Progress Notes (Signed)
Manuel Jackson was seen today in the movement disorders clinic for neurologic consultation at the request of [name].  His PCP is [name], MD.  The patient is seen today in neurologic consultation, accompanied by his wife who supplements the history.  He has been taken care of for many years by [name], but the drive to [facility] has become long and cumbersome for him and he is trying to find a neurologist closer to home.  I have reviewed and appreciated the records from [name].  He first saw [name] and was diagnosed with Parkinson's disease in approximately 2008, when [name] was with [facility] neurology.  He followed [name] to [facility].  The patient's first symptom was right hand rest tremor.  He was started on Azilect first and Mirapex came not long thereafter.  He has been on levodopa since approximately 2011.  The patient felt that each of these medications helped either tremor or balance or both at the initiation of the medication, but is having more difficulty now with freezing and on/off.  He developed hallucinations in approximately May, 2013 and did not fully respond to quetiapine.  He saw [name] for a one-time consultation in 2013 and [name] decided to taper him off of Mirapex.  Unfortunately, the patient became very stiff off of Mirapex and he ended up going back on the drug and is now back on it at pramipexole, 0.25 mg 4 times per day.  He is also on the Exelon patch 9.5 mg daily, Namenda 10 mg twice a day as well as his Seroquel 25 mg bid; he is still having hallucinations despite this.  As above, he has had more difficulties with motor fluctuations and called [name] in June and his dose of carbidopa/levodopa was adjusted slightly so that he is now taking 2 tablets at 8 AM/2 at noon/1 tablet at 2 PM/1 tablet at 4 PM/2 tablets at 6:30 PM and one tablet at bedtime.  Pt states that his medication works right after he takes the medication but by 11 am he  is frozen.  He states that medication wears out after 3 hours.  His wife states that she has been taking the 2 tablets at noon and starting giving one at 10 am and one at noon and that works better.  Interestingly, he wakes up at 6 am but doesn't take first med until 8am.     09/28/15 update:  The patient is following up today, accompanied by his wife who supplements the history.  He has a history of Parkinson's disease with Parkinson's disease dementia.  He is on carbidopa/levodopa 25/100, 2 tablets in the morning and 7 other tablets spread evenly throughout the day.  Last visit we added carbidopa/levodopa 50/200 at nighttime.  Despite attempts in the past by his previous physicians to get him off of Mirapex, he ended up going back on it and is on Mirapex, 0.25 mg 4 times per day.  He is on Seroquel 25 mg twice a day and last visit we discussed adding Nuplazid.  We also discussed the possibilities of duopa because of motor fluctuations.  He is on Exelon patch, 9.5 mg daily and Namenda, 10 mg twice a day.  He continues to have hallucinations.  He has had a few falls since last visit and the days that he falls are the days that he has not slept the night before.  He still isn't sleeping well.  He refuses to  use a walker or ambulatory assistive device.  He has a pressure sore.  His wife states that she thinks that he got it from when she had to pull him by his heels across the floor to move him when he was frozen.  She states that it is almost healed now.  He had graduated from PT but has restarted it yesterday.    11/14/15 update:  The patient has a history of Parkinson's disease with Parkinson's disease dementia.  He is on carbidopa/levodopa 25/100, 2 tablets in the morning, followed by 7 other dosages spread evenly throughout the day.  He is also on carbidopa/levodopa 50/200 at nighttime.  His wife will occasionally give him an extra levodopa (she did today because she knew she was coming here and he would  freeze).  Last visit, his quetiapine was changed from 25 mg twice a day to 50 mg at night.  This was primarily because of issues with insomnia. He is now going to bed at 8:30 pm and staying in bed without awakening until 7am.   We did, however, last visit talk some about Nuplazid.  This is because of hallucinations.  Also because of this, I dropped his pramipexole from 0.25 mg 4 times a day to 0.25 mg twice per day. Wife states that this is much better.  Rarely having hallucinations now.  Did not seem to have any bad consequences from dropping the pramipexole to a lower dosage.  I have been doing this cautiously because he was unable to get off of that in the past.  He does have a history of Parkinson's disease dementia and is on the Exelon patch as well as Namenda, 10 mg twice a day.  His wife brings me a picture of his pressure wound on his right buttocks and reports he is still doing wound care but it is getting better.  He is exercising on his cardiofit and has gone back to the [employer].  He fell twice since last visit; one time fell forward out of the chair while eating.  He didn't get hurt.  With the other, he was in the bathroom and didn't have his walker.  He was hanging his towel up.  Wife asks me about him doing a colonoscopy.  States that his primary care physician would like him to do that.  She is reluctant.  03/19/16 update:  The patient has a history of Parkinson's disease with Parkinson's disease dementia.  This patient is accompanied in the office by his spouse who supplements the history.  He is on carbidopa/levodopa 25/100, 2 tablets in the morning, followed by 7 other dosages spread evenly throughout the day.  He is also on carbidopa/levodopa 50/200 at nighttime.  I tried to wean him off of the mirapex but his wife states that he "couldn't do it" and he is only on 0.25 mg bid.    He remains on azilect.  He is on quetiapine 50 mg at night.  This was primarily because of issues with insomnia, which he  is still having.  We did, however, last visit talk some about Nuplazid for hallucinations but have held on that thus far.  He states that hallucinations have gotten a bit worse.  Wife states that was really bad when she was giving him tylenol PM but she stopped that and is giving melatonin for insomnia now.   He does have a history of Parkinson's disease dementia and is on the Exelon patch as well as Namenda, 10 mg  twice a day.  He is exercising several times a week at the [facility], doing biking.  Has had multiple falls.  Multiple episodes of freezing.  However, doesn't like to use the walker at home.    06/20/16 update:  The patient has a history of Parkinson's disease with Parkinson's disease dementia.  This patient is accompanied in the office by his spouse who supplements the history.  He is on carbidopa/levodopa 25/100, 2 tablets in the morning, followed by 7 other dosages spread evenly throughout the day.  He is also on carbidopa/levodopa 50/200 at nighttime.  He is also still on low-dose pramipexole at 0.25 mg twice a day.  Despite trying to wean this off in the past, he was not able to get off of it and went back on it.  He is on Azilect, 1 mg daily.  He has Parkinson's disease dementia and is on Exelon Patch and Namenda.  Last visit, after checking his QT interval which was normal, we added Nuplazid for hallucinations.  He did well after a couple of weeks, and then began to have some freezing and his wife thought that this was medication related.  However, he also had a pressure ulcer and urinary urgency at the time.  I urged him to get checked by primary care before we went off of the medication.  They did not call back since that time.  He is also on very low-dose Seroquel for insomnia.  Pt and wife are not sure if the nuplazid it is helpful but think that maybe it has be (but really aren't sure).  2 episodes of freezing so much that he had to go to the ER in [place].  State that the ER did not  help them but they weren't sure what else to do.  Has at least 4 hours of off time per day if not more.  09/24/16 update:  Pt is f/u today, accompanied by his wife who supplements the history.  I sent him for a c/s for GJ tube placement for administration of duopa.  Felt to be a good candidate but patient has not yet proceeded with the procedure.  States today that he has decided that he doesn't want to go through with the procedure. He is still on carbidopa/levodopa 25/100, 2 in the AM followed by 7 other dosages during the day and then carbidopa/levodopa 50/200 at night.  He is on pramipexole 0.25 mg bid.  Still on nuplazid 34 mg daily for hallucinations.  His wife states that he is much better and "she can reason with him much better if he has the hallucinations."   He is on seroquel at low dose as well, and pt/wife understand risks of combining these meds.  He is having no SE of exelon/namenda although wife not sure that they help.  Memory has slowly been deteriorating for quite some time.  He is biking at the [facility] [place] and has lost 20 lbs that way.  That has helped him and today was one of the first freezing episodes he has had.  Sleeping better.  He had 3 falls since last visit.  2 were at the [facility] and he was walking and just fell forward and he got right back up without getting up.  Last fall was last week and it was at the mall in [place].    PREVIOUS MEDICATIONS: Sinemet, Mirapex and Seroquel; he tried klonopin - 0.5 mg - full tablet at bedtime but seemed to "make worse"  ALLERGIES:   Allergies  Allergen Reactions  . Naproxen Swelling    CURRENT MEDICATIONS:  Outpatient Encounter Prescriptions as of 09/24/2016  Medication Sig  . carbidopa-levodopa (SINEMET CR) 50-200 MG tablet TAKE 1 TABLET BY MOUTH AT BEDTIME.  . carbidopa-levodopa (SINEMET IR) 25-100 MG tablet 2 tablets at 8 am, 2 at 11 am, 2 at 3 pm, 2 at 6 pm  . cholecalciferol (VITAMIN D) 1000 UNITS tablet Take 1,000 Units by  mouth daily.  Marland Kitchen escitalopram (LEXAPRO) 10 MG tablet Take 10 mg by mouth daily with supper.  . hydrochlorothiazide (HYDRODIURIL) 25 MG tablet Take 25 mg by mouth daily.   . memantine (NAMENDA) 10 MG tablet Take 1 tablet (10 mg total) by mouth daily.  Marland Kitchen Pimavanserin Tartrate (NUPLAZID) 17 MG TABS Take 2 tablets by mouth daily.  . pramipexole (MIRAPEX) 0.5 MG tablet Take 0.5 tablets (0.25 mg total) by mouth 2 (two) times daily. With 8 am Levodopa and 8 pm CR Levodopa  . QUEtiapine (SEROQUEL) 25 MG tablet Take 2 tablets (50 mg total) by mouth at bedtime.  . rivastigmine (EXELON) 9.5 mg/24hr Place 1 patch (9.5 mg total) onto the skin daily.  . [DISCONTINUED] AZILECT 1 MG TABS tablet Take 1 tablet (1 mg total) by mouth daily. (Patient not taking: Reported on 08/20/2016)  . [DISCONTINUED] Misc. Devices (TRANSPORT CHAIR) MISC 1 Device by Does not apply route daily. (Patient not taking: Reported on 08/20/2016)   No facility-administered encounter medications on file as of 09/24/2016.     PAST MEDICAL HISTORY:   Past Medical History:  Diagnosis Date  . Hernia of abdominal cavity   . Parkinson's disease ([place])     PAST SURGICAL HISTORY:   Past Surgical History:  Procedure Laterality Date  . HERNIA REPAIR    . IR GENERIC HISTORICAL  08/20/2016   IR RADIOLOGIST EVAL & MGMT 08/20/2016 Markus Daft, MD [facility] INTERV RAD    SOCIAL HISTORY:   Social History   Social History  . Marital status: Married    Spouse name: N/A  . Number of children: N/A  . Years of education: N/A   Occupational History  . Not on file.   Social History Main Topics  . Smoking status: Former Research scientist (life sciences)  . Smokeless tobacco: Never Used  . Alcohol use 0.0 oz/week  . Drug use: No  . Sexual activity: Not on file   Other Topics Concern  . Not on file   Social History Narrative   Lives with wife in story home.  Has no children.  Retired Geophysical data processor for [employer]  Education: associate's degree.    FAMILY HISTORY:     Family Status  Relation Status  . Mother Deceased  . Father Deceased  . Brother Deceased    ROS:  A complete 10 system review of systems was obtained and was unremarkable apart from what is mentioned above.  PHYSICAL EXAMINATION:    VITALS:   Vitals:   09/24/16 1454  BP: 120/70  Pulse: 64   Wt Readings from Last 3 Encounters:  08/20/16 150 lb (68 kg)  06/20/16 153 lb (69.4 kg)  11/14/15 178 lb (80.7 kg)     GEN:  The patient appears stated age and is in NAD. HEENT:  Normocephalic, atraumatic.  The mucous membranes are moist. The superficial temporal arteries are without ropiness or tenderness. CV:  RRR Lungs:  CTAB Neck/HEME:  There are no carotid bruits bilaterally.  Neurological examination:  Orientation: He scored an 8/30 on his MoCA  previously.  The patient is alert and oriented x2 today.   Cranial nerves: There is good facial symmetry.  There is significant facial hypomimia.    The speech is fluent and clear. Soft palate rises symmetrically and there is no tongue deviation. Hearing is intact to conversational tone. Sensation: Sensation is intact to light touch throughout. Motor: Strength is 5/5 in the bilateral upper and lower extremities.   Shoulder shrug is equal and symmetric.  There is no pronator drift.   Movement examination: Tone: There is normal tone in the bilateral upper extremities.  The tone in the lower extremities is normal.  Abnormal movements: No tremor or dyskinesia today. Coordination:  There is mild decremation with RAM's, with all form of RAMS, including alternating supination and pronation of the forearm, hand opening and closing, finger taps, heel taps and toe taps bilaterally.  Gait and Station: The patient has no difficulty arising out of his chair for me, but had so much difficulty with freezing getting out of the waiting room that my medical assistant had to get him a wheelchair.  He did have start hesitation for me, but otherwise was able to  walk down the hall with good stride length.  He had no trouble with the turns and easily walked back into the room.  ASSESSMENT/PLAN:  1.  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  Only on 0.25 mg bid but insists it helps  -talked again about duopa.  He had a consult and was felt to be a good candidate.  He even met with a patient representative, but ultimately the patient decided against having it.  We talked about this again today and he will let me know if he changes his mind.  -Congratulated the patient on all of the exercises he is doing.  He has lost a significant amount of weight with exercise and states that he really is feeling good.  He feels that the exercises helping dramatically with the freezing and with his Parkinson's disease in general.  -He is not often of Azilect. 2.  RBD  -has tried klonopin but thinks that he had SE (today states that he just thinks that it didn't work).  Will need to monitor.  If need in future, will try 1/2 of the tablet or even less.   3.  PDD  -continue exelon patch 9.5 mg daily  -on namenda 10 mg bid.  RF today  -home safety discussed in detail.  Shouldn't be left alone.  -Patient's wife thinks that Nuplazid, 34 mg, is helping significantly.  He does still have some hallucinations, but she thinks that it has helped her reason with him through the hallucinations.  It has also dropped the degree of hallucinations.  Patient and his wife understand the blackbox warning.  Understand that it, especially along with Seroquel, can prolong QT interval.  They understand what this means. 4.  Insomnia  -on 50 mg seroquel and melatonin  -Talked to them about the interaction between Nuplazid and  Seroquel.  Talked about the fact that they are both atypical antipsychotic medications.  Both of these have a black box warning.  Discussed with the blackbox warning was in detail with the patient and his wife.  He does not have prolonged QT.  The patient and his wife decided that the benefits outweigh the risks.  Continue both for now 5.  Follow up in 3-4 months.  Much greater than 50% of this visit was spent in counseling with the patient and the family.  Total face to face time:  30 min

## 2016-09-24 ENCOUNTER — Encounter: Payer: Self-pay | Admitting: Neurology

## 2016-09-24 ENCOUNTER — Ambulatory Visit (INDEPENDENT_AMBULATORY_CARE_PROVIDER_SITE_OTHER): Payer: Medicare Other | Admitting: Neurology

## 2016-09-24 VITALS — BP 120/70 | HR 64

## 2016-09-24 DIAGNOSIS — G20 Parkinson's disease: Secondary | ICD-10-CM

## 2016-09-24 DIAGNOSIS — R441 Visual hallucinations: Secondary | ICD-10-CM | POA: Diagnosis not present

## 2016-09-24 DIAGNOSIS — F0280 Dementia in other diseases classified elsewhere without behavioral disturbance: Secondary | ICD-10-CM | POA: Diagnosis not present

## 2016-09-24 MED ORDER — PIMAVANSERIN TARTRATE 17 MG PO TABS: 2.0000 | ORAL_TABLET | Freq: Every day | ORAL | 1 refills | Status: DC

## 2016-09-24 NOTE — Telephone Encounter (Signed)
Appt today. Will discuss at appt.

## 2016-10-09 ENCOUNTER — Telehealth: Payer: Self-pay | Admitting: Neurology

## 2016-10-09 NOTE — Telephone Encounter (Signed)
Spoke with [facility] and they let us know that patient is no longer active because he has decided against Duopa.

## 2016-10-09 NOTE — Telephone Encounter (Signed)
VM-Chris with [facility] left a message regarding PT/Dawn CB# [phone] [id number]

## 2016-10-10 ENCOUNTER — Other Ambulatory Visit: Payer: Self-pay | Admitting: Neurology

## 2016-10-13 ENCOUNTER — Other Ambulatory Visit: Payer: Self-pay | Admitting: Neurology

## 2016-12-07 ENCOUNTER — Other Ambulatory Visit: Payer: Self-pay | Admitting: Neurology

## 2016-12-25 NOTE — Progress Notes (Signed)
Manuel Jackson was seen today in the movement disorders clinic for neurologic consultation at the request of [name].  His PCP is [name], MD.  The patient is seen today in neurologic consultation, accompanied by his wife who supplements the history.  He has been taken care of for many years by [name], but the drive to [facility] has become long and cumbersome for him and he is trying to find a neurologist closer to home.  I have reviewed and appreciated the records from [name].  He first saw [name] and was diagnosed with Parkinson's disease in approximately 2008, when [name] was with [facility] neurology.  He followed [name] to [facility].  The patient's first symptom was right hand rest tremor.  He was started on Azilect first and Mirapex came not long thereafter.  He has been on levodopa since approximately 2011.  The patient felt that each of these medications helped either tremor or balance or both at the initiation of the medication, but is having more difficulty now with freezing and on/off.  He developed hallucinations in approximately May, 2013 and did not fully respond to quetiapine.  He saw [name] for a one-time consultation in 2013 and [name] decided to taper him off of Mirapex.  Unfortunately, the patient became very stiff off of Mirapex and he ended up going back on the drug and is now back on it at pramipexole, 0.25 mg 4 times per day.  He is also on the Exelon patch 9.5 mg daily, Namenda 10 mg twice a day as well as his Seroquel 25 mg bid; he is still having hallucinations despite this.  As above, he has had more difficulties with motor fluctuations and called [name] in June and his dose of carbidopa/levodopa was adjusted slightly so that he is now taking 2 tablets at 8 AM/2 at noon/1 tablet at 2 PM/1 tablet at 4 PM/2 tablets at 6:30 PM and one tablet at bedtime.  Pt states that his medication works right after he takes the medication but by 11 am he  is frozen.  He states that medication wears out after 3 hours.  His wife states that she has been taking the 2 tablets at noon and starting giving one at 10 am and one at noon and that works better.  Interestingly, he wakes up at 6 am but doesn't take first med until 8am.     09/28/15 update:  The patient is following up today, accompanied by his wife who supplements the history.  He has a history of Parkinson's disease with Parkinson's disease dementia.  He is on carbidopa/levodopa 25/100, 2 tablets in the morning and 7 other tablets spread evenly throughout the day.  Last visit we added carbidopa/levodopa 50/200 at nighttime.  Despite attempts in the past by his previous physicians to get him off of Mirapex, he ended up going back on it and is on Mirapex, 0.25 mg 4 times per day.  He is on Seroquel 25 mg twice a day and last visit we discussed adding Nuplazid.  We also discussed the possibilities of duopa because of motor fluctuations.  He is on Exelon patch, 9.5 mg daily and Namenda, 10 mg twice a day.  He continues to have hallucinations.  He has had a few falls since last visit and the days that he falls are the days that he has not slept the night before.  He still isn't sleeping well.  He refuses to  use a walker or ambulatory assistive device.  He has a pressure sore.  His wife states that she thinks that he got it from when she had to pull him by his heels across the floor to move him when he was frozen.  She states that it is almost healed now.  He had graduated from PT but has restarted it yesterday.    11/14/15 update:  The patient has a history of Parkinson's disease with Parkinson's disease dementia.  He is on carbidopa/levodopa 25/100, 2 tablets in the morning, followed by 7 other dosages spread evenly throughout the day.  He is also on carbidopa/levodopa 50/200 at nighttime.  His wife will occasionally give him an extra levodopa (she did today because she knew she was coming here and he would  freeze).  Last visit, his quetiapine was changed from 25 mg twice a day to 50 mg at night.  This was primarily because of issues with insomnia. He is now going to bed at 8:30 pm and staying in bed without awakening until 7am.   We did, however, last visit talk some about Nuplazid.  This is because of hallucinations.  Also because of this, I dropped his pramipexole from 0.25 mg 4 times a day to 0.25 mg twice per day. Wife states that this is much better.  Rarely having hallucinations now.  Did not seem to have any bad consequences from dropping the pramipexole to a lower dosage.  I have been doing this cautiously because he was unable to get off of that in the past.  He does have a history of Parkinson's disease dementia and is on the Exelon patch as well as Namenda, 10 mg twice a day.  His wife brings me a picture of his pressure wound on his right buttocks and reports he is still doing wound care but it is getting better.  He is exercising on his cardiofit and has gone back to the [employer].  He fell twice since last visit; one time fell forward out of the chair while eating.  He didn't get hurt.  With the other, he was in the bathroom and didn't have his walker.  He was hanging his towel up.  Wife asks me about him doing a colonoscopy.  States that his primary care physician would like him to do that.  She is reluctant.  03/19/16 update:  The patient has a history of Parkinson's disease with Parkinson's disease dementia.  This patient is accompanied in the office by his spouse who supplements the history.  He is on carbidopa/levodopa 25/100, 2 tablets in the morning, followed by 7 other dosages spread evenly throughout the day.  He is also on carbidopa/levodopa 50/200 at nighttime.  I tried to wean him off of the mirapex but his wife states that he "couldn't do it" and he is only on 0.25 mg bid.    He remains on azilect.  He is on quetiapine 50 mg at night.  This was primarily because of issues with insomnia, which he  is still having.  We did, however, last visit talk some about Nuplazid for hallucinations but have held on that thus far.  He states that hallucinations have gotten a bit worse.  Wife states that was really bad when she was giving him tylenol PM but she stopped that and is giving melatonin for insomnia now.   He does have a history of Parkinson's disease dementia and is on the Exelon patch as well as Namenda, 10 mg  twice a day.  He is exercising several times a week at the [facility], doing biking.  Has had multiple falls.  Multiple episodes of freezing.  However, doesn't like to use the walker at home.    06/20/16 update:  The patient has a history of Parkinson's disease with Parkinson's disease dementia.  This patient is accompanied in the office by his spouse who supplements the history.  He is on carbidopa/levodopa 25/100, 2 tablets in the morning, followed by 7 other dosages spread evenly throughout the day.  He is also on carbidopa/levodopa 50/200 at nighttime.  He is also still on low-dose pramipexole at 0.25 mg twice a day.  Despite trying to wean this off in the past, he was not able to get off of it and went back on it.  He is on Azilect, 1 mg daily.  He has Parkinson's disease dementia and is on Exelon Patch and Namenda.  Last visit, after checking his QT interval which was normal, we added Nuplazid for hallucinations.  He did well after a couple of weeks, and then began to have some freezing and his wife thought that this was medication related.  However, he also had a pressure ulcer and urinary urgency at the time.  I urged him to get checked by primary care before we went off of the medication.  They did not call back since that time.  He is also on very low-dose Seroquel for insomnia.  Pt and wife are not sure if the nuplazid it is helpful but think that maybe it has be (but really aren't sure).  2 episodes of freezing so much that he had to go to the ER in [place].  State that the ER did not  help them but they weren't sure what else to do.  Has at least 4 hours of off time per day if not more.  09/24/16 update:  Pt is f/u today, accompanied by his wife who supplements the history.  I sent him for a c/s for GJ tube placement for administration of duopa.  Felt to be a good candidate but patient has not yet proceeded with the procedure.  States today that he has decided that he doesn't want to go through with the procedure. He is still on carbidopa/levodopa 25/100, 2 in the AM followed by 7 other dosages during the day and then carbidopa/levodopa 50/200 at night.  He is on pramipexole 0.25 mg bid.  Still on nuplazid 34 mg daily for hallucinations.  His wife states that he is much better and "she can reason with him much better if he has the hallucinations."   He is on seroquel at low dose as well, and pt/wife understand risks of combining these meds.  He is having no SE of exelon/namenda although wife not sure that they help.  Memory has slowly been deteriorating for quite some time.  He is biking at the [facility] [place] and has lost 20 lbs that way.  That has helped him and today was one of the first freezing episodes he has had.  Sleeping better.  He had 3 falls since last visit.  2 were at the [facility] and he was walking and just fell forward and he got right back up without getting up.  Last fall was last week and it was at the mall in [place].    12/26/16 update:  Pt f/u today.  This patient is accompanied in the office by his spouse who supplements the history.  No taking carbidopa/levodopa 25/100, 2 po tid and then carbidopa/levodopa 50/200 at night.   On both nuplazid, 34 mg and seroquel, 50 mg.  Wife understands that these are both atypical antipsychotics and understands risk of combining these meds but thinks that quality of life with these is most important.  Has done much better with hallucinations and only has them in the car and thinks that head rests in the car are people.  No falls at all.   Wife states that he has really has devoted to exercise and has been moving better and able to stay up later now.  Has trouble getting in and out of the bed and wife has to help him with this.  Wife has to life the legs.   On 0.25 mg bid of pramipexole.  Tried several times to d/c without success.  Exercising at [facility].  No lightheadedness or near syncope.  Mood is good.  He did get lost on one occasion.  Wife left him just for a second and wondered to neighbors  PREVIOUS MEDICATIONS: Sinemet, Mirapex and Seroquel; he tried klonopin - 0.5 mg - full tablet at bedtime but seemed to "make worse"  ALLERGIES:   Allergies  Allergen Reactions  . Naproxen Swelling    CURRENT MEDICATIONS:  Outpatient Encounter Prescriptions as of 12/26/2016  Medication Sig  . carbidopa-levodopa (SINEMET CR) 50-200 MG tablet TAKE 1 TABLET BY MOUTH AT BEDTIME.  . carbidopa-levodopa (SINEMET IR) 25-100 MG tablet 2 tablets in the morning, then one 7 times daily  . cholecalciferol (VITAMIN D) 1000 UNITS tablet Take 1,000 Units by mouth daily.  Marland Kitchen escitalopram (LEXAPRO) 10 MG tablet Take 10 mg by mouth daily with supper.  . hydrochlorothiazide (HYDRODIURIL) 25 MG tablet Take 25 mg by mouth daily.   . memantine (NAMENDA) 10 MG tablet Take 1 tablet (10 mg total) by mouth daily.  Marland Kitchen Pimavanserin Tartrate (NUPLAZID) 17 MG TABS Take 2 tablets by mouth daily.  . pramipexole (MIRAPEX) 0.5 MG tablet Take 0.5 tablets (0.25 mg total) by mouth 2 (two) times daily. With 8 am Levodopa and 8 pm CR Levodopa  . QUEtiapine (SEROQUEL) 25 MG tablet TAKE 2 TABLETS (50 MG TOTAL) BY MOUTH AT BEDTIME.  . rivastigmine (EXELON) 9.5 mg/24hr Place 1 patch (9.5 mg total) onto the skin daily.   No facility-administered encounter medications on file as of 12/26/2016.     PAST MEDICAL HISTORY:   Past Medical History:  Diagnosis Date  . Hernia of abdominal cavity   . Parkinson's disease ([place])     PAST SURGICAL HISTORY:   Past Surgical History:    Procedure Laterality Date  . HERNIA REPAIR    . IR GENERIC HISTORICAL  08/20/2016   IR RADIOLOGIST EVAL & MGMT 08/20/2016 Markus Daft, MD [facility] INTERV RAD    SOCIAL HISTORY:   Social History   Social History  . Marital status: Married    Spouse name: N/A  . Number of children: N/A  . Years of education: N/A   Occupational History  . Not on file.   Social History Main Topics  . Smoking status: Former Research scientist (life sciences)  . Smokeless tobacco: Never Used  . Alcohol use 0.0 oz/week  . Drug use: No  . Sexual activity: Not on file   Other Topics Concern  . Not on file   Social History Narrative   Lives with wife in story home.  Has no children.  Retired Geophysical data processor for [employer]  Education: associate's degree.  FAMILY HISTORY:   Family Status  Relation Status  . Mother Deceased  . Father Deceased  . Brother Deceased    ROS:  A complete 10 system review of systems was obtained and was unremarkable apart from what is mentioned above.  PHYSICAL EXAMINATION:    VITALS:   Vitals:   12/26/16 1349  BP: 126/72  Pulse: 60  Weight: 158 lb (71.7 kg)  Height: '5\' 7"'$  (1.702 m)   Wt Readings from Last 3 Encounters:  12/26/16 158 lb (71.7 kg)  08/20/16 150 lb (68 kg)  06/20/16 153 lb (69.4 kg)     GEN:  The patient appears stated age and is in NAD. HEENT:  Normocephalic, atraumatic.  The mucous membranes are moist. The superficial temporal arteries are without ropiness or tenderness. CV:  RRR Lungs:  CTAB Neck/HEME:  There are no carotid bruits bilaterally.  Neurological examination:  Orientation: He scored an 8/30 on his MoCA previously.  The patient is alert and oriented x2 today.   Cranial nerves: There is good facial symmetry.  There is significant facial hypomimia.    The speech is fluent and clear. Soft palate rises symmetrically and there is no tongue deviation. Hearing is intact to conversational tone. Sensation: Sensation is intact to light touch throughout. Motor:  Strength is 5/5 in the bilateral upper and lower extremities.   Shoulder shrug is equal and symmetric.  There is no pronator drift.   Movement examination: Tone: There is normal tone in the bilateral upper extremities.  The tone in the lower extremities is normal.  Abnormal movements: There is dyskinesia in the LLE extremity Coordination:  There is mild decremation with RAM's, with all form of RAMS, including alternating supination and pronation of the forearm, hand opening and closing, finger taps, heel taps and toe taps bilaterally.  Gait and Station: The patient has no difficulty arising out of his chair for me, and had very minimal start hesitation.  He has dyskinetic arm swing  ASSESSMENT/PLAN:  1.  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   May take an extra if needed throughout the day  -Continue carbidopa/levodopa 50/200 CR at bedtime  -Tried to wean mirapex but pt went back on.  Only on 0.25 mg bid but insists it helps  -talked again about duopa.  He had a consult and was felt to be a good candidate.  He even met with a patient representative, but ultimately the patient decided against having it.  We talked about this again today and he will let me know if he changes his mind.  Hopefully, the pump patch system will be out in the future and I discussed that with him today.  -Congratulated the patient on all of the exercises he is doing.  He has lost a significant amount of weight with exercise and states that he really is feeling good.  He feels that the exercises helping dramatically with the freezing and with his Parkinson's disease in general.  -He is now off of Azilect.  -he asked me about amantadine ER (gocovri) and I don't recommend that right now  -given life alert resources for GPS tracking 2.  RBD  -has tried klonopin but thinks that he had SE (today states that he just thinks that it didn't work).  Will need  to monitor.  If need in future, will try 1/2 of the tablet or even less.   3.  PDD  -continue exelon patch  9.5 mg daily  -on namenda 10 mg bid.  RF today  -home safety discussed in detail.  Shouldn't be left alone.  -Patient's wife thinks that Nuplazid, 34 mg, is helping significantly.  He does still have some hallucinations, but she thinks that it has helped her reason with him through the hallucinations.  It has also dropped the degree of hallucinations.  Patient and his wife understand the blackbox warning.  Understand that it, especially along with Seroquel, can prolong QT interval.  They understand what this means. 4.  Insomnia  -on 50 mg seroquel and melatonin  -Talked to them about the interaction between Nuplazid and Seroquel.  Talked about the fact that they are both atypical antipsychotic medications.  Both of these have a black box warning.  Discussed with the blackbox warning was in detail with the patient and his wife.  He does not have prolonged QT.  The patient and his wife decided that the benefits outweigh the risks.  Continue both for now 5.  Follow up in 3-4 months.  Much greater than 50% of this visit was spent in counseling with the patient and the family.  Total face to face time:  30 min

## 2016-12-26 ENCOUNTER — Ambulatory Visit (INDEPENDENT_AMBULATORY_CARE_PROVIDER_SITE_OTHER): Payer: Medicare Other | Admitting: Neurology

## 2016-12-26 ENCOUNTER — Encounter: Payer: Self-pay | Admitting: Neurology

## 2016-12-26 VITALS — BP 126/72 | HR 60 | Ht 67.0 in | Wt 158.0 lb

## 2016-12-26 DIAGNOSIS — F0280 Dementia in other diseases classified elsewhere without behavioral disturbance: Secondary | ICD-10-CM

## 2016-12-26 DIAGNOSIS — G20 Parkinson's disease: Secondary | ICD-10-CM

## 2016-12-26 DIAGNOSIS — R441 Visual hallucinations: Secondary | ICD-10-CM | POA: Diagnosis not present

## 2016-12-26 NOTE — Patient Instructions (Signed)
Life Alert Resources  Medical Alert Systems that can locate patients outside the home:  Medical Alert  Website: http://mobilealertsystems.com/  Phone: 1-800-800-2573  [place] [employer] Medical Alert Service  Website: http://www.lifelinesys.com/content/lifeline-products/get-life-gosafe   Phone: 1-855-681-5351  [employer] Gizmo Gadget Watch   Website:  https://www.verizonwireless.com/connected-devices/lg-gizmogadget/?lid=sayt&sayt=gizmo*    Medial Alert systems that link to smart phones:  http://www.lifelinesys.com/content/lifeline-products/response-app http://www.greatcall.com/medical-apps/Fivestar http://www.lifealert.com/EmergencyMobileApp.aspx  Alzheimer's Association Tracking Device:  https://www.alz.org/care/dementia-medic-alert-safe-return.asp www.medicalert.org/safereturn 1.888.572.8566    

## 2017-01-02 ENCOUNTER — Other Ambulatory Visit: Payer: Self-pay | Admitting: Neurology

## 2017-01-07 ENCOUNTER — Telehealth: Payer: Self-pay | Admitting: Neurology

## 2017-01-07 NOTE — Telephone Encounter (Signed)
Wife made aware we did get Nuplazid approved through insurance.

## 2017-01-07 NOTE — Telephone Encounter (Signed)
Patient wife called and states that the pharmacy has been trying to get ahold of us about the nuplazid and has not heard back from us patient phone number is [phone]

## 2017-02-06 ENCOUNTER — Other Ambulatory Visit: Payer: Self-pay | Admitting: Neurology

## 2017-04-01 ENCOUNTER — Telehealth: Payer: Self-pay | Admitting: Neurology

## 2017-04-01 NOTE — Telephone Encounter (Signed)
Called Tobi Bastosnna back but she had gone to the store.

## 2017-04-01 NOTE — Telephone Encounter (Signed)
Patient is on nuplazid 34 mg and levodopa but started acting differently.  Does he need to get tested for UTI?  Please advise.

## 2017-04-01 NOTE — Telephone Encounter (Signed)
Caller: Anna  Urgent? Yes  Reason for the call: Not able to walk at times and talks to her as if she is not there. He also said he does not feel well. She also said she had to call 911 the other day because she could not pick him up off the floor. He said to her "we need to get rid of that care taker Tobi Bastosnna" which is her. She said things seem different with him. Please call. Thanks

## 2017-04-01 NOTE — Telephone Encounter (Signed)
Yes, would check CBC, CMP, and urinalysis. Thanks

## 2017-04-03 ENCOUNTER — Other Ambulatory Visit: Payer: Self-pay | Admitting: *Deleted

## 2017-04-03 DIAGNOSIS — R410 Disorientation, unspecified: Secondary | ICD-10-CM

## 2017-04-03 NOTE — Telephone Encounter (Signed)
I spoke with Tobi Bastosnna and informed her that we do need lab work.  She will bring patient in tomorrow.

## 2017-04-04 ENCOUNTER — Other Ambulatory Visit (INDEPENDENT_AMBULATORY_CARE_PROVIDER_SITE_OTHER): Payer: Medicare Other

## 2017-04-04 DIAGNOSIS — R410 Disorientation, unspecified: Secondary | ICD-10-CM

## 2017-04-04 LAB — CBC
HEMATOCRIT: 42.3 % (ref 39.0–52.0)
HEMOGLOBIN: 14.1 g/dL (ref 13.0–17.0)
MCHC: 33.3 g/dL (ref 30.0–36.0)
MCV: 98.5 fl (ref 78.0–100.0)
PLATELETS: 329 10*3/uL (ref 150.0–400.0)
RBC: 4.3 Mil/uL (ref 4.22–5.81)
RDW: 12.6 % (ref 11.5–15.5)
WBC: 9.4 10*3/uL (ref 4.0–10.5)

## 2017-04-04 LAB — COMPREHENSIVE METABOLIC PANEL
ALBUMIN: 4.2 g/dL (ref 3.5–5.2)
ALT: 6 U/L (ref 0–53)
AST: 18 U/L (ref 0–37)
Alkaline Phosphatase: 70 U/L (ref 39–117)
BUN: 19 mg/dL (ref 6–23)
CALCIUM: 9.4 mg/dL (ref 8.4–10.5)
CHLORIDE: 103 meq/L (ref 96–112)
CO2: 25 mEq/L (ref 19–32)
Creatinine, Ser: 0.87 mg/dL (ref 0.40–1.50)
GFR: 92.63 mL/min (ref >60.00)
Glucose, Bld: 106 mg/dL — ABNORMAL HIGH (ref 70–99)
POTASSIUM: 3.3 meq/L — AB (ref 3.5–5.1)
Sodium: 141 mEq/L (ref 135–145)
Total Bilirubin: 0.5 mg/dL (ref 0.2–1.2)
Total Protein: 7.1 g/dL (ref 6.0–8.3)

## 2017-04-07 ENCOUNTER — Other Ambulatory Visit: Payer: Self-pay | Admitting: Neurology

## 2017-04-07 LAB — URINALYSIS
BILIRUBIN URINE: NEGATIVE
Hgb urine dipstick: NEGATIVE
LEUKOCYTES UA: NEGATIVE
NITRITE: NEGATIVE
PH: 7 (ref 5.0–8.0)
Specific Gravity, Urine: 1.02 (ref 1.000–1.030)
Total Protein, Urine: NEGATIVE
Urine Glucose: NEGATIVE
Urobilinogen, UA: 0.2 (ref 0.0–1.0)

## 2017-04-10 ENCOUNTER — Telehealth: Payer: Self-pay | Admitting: Neurology

## 2017-04-10 NOTE — Telephone Encounter (Signed)
Patient's wife made aware of results and advise.

## 2017-04-10 NOTE — Telephone Encounter (Signed)
Please advise on lab results.

## 2017-04-10 NOTE — Telephone Encounter (Signed)
Patient wife called and would like the result of the blood work that was done on Friday

## 2017-04-10 NOTE — Telephone Encounter (Signed)
Pt looks a little bit dehyrated and potassium is just a little bit low.  Have him drink plenty of fluid and if he likes bananas and potassium rich foods, eat those.

## 2017-04-22 ENCOUNTER — Other Ambulatory Visit: Payer: Self-pay | Admitting: Neurology

## 2017-04-24 ENCOUNTER — Telehealth: Payer: Self-pay | Admitting: Neurology

## 2017-04-24 NOTE — Telephone Encounter (Signed)
Agree with narcotics contributing.

## 2017-04-24 NOTE — Telephone Encounter (Signed)
Caller: ANNA  Urgent? Yes  Reason for the call: He has fallen everyday this week. He is unable to get up. He is weak. He is on the floor now and she will have to call 9//. He was put on Codine by his PCP for a bad cold he had. She is unable to get him up. She is not sure what keeps making him fall. Please call. Thanks

## 2017-04-24 NOTE — Telephone Encounter (Signed)
Wife called back.  She states he has had a history of falls, significantly worse this week. He started Codeine cough medicine on Monday of this week. Only recent change. I advised this is probably a major contributor to falls. She will call for help getting him up from his fall. She was also encouraged to contact PCP about changing medication to something safer with his fall risk. He has not had LOC/ hit head. I advised if Dr. Arbutus Leasat had any other suggestions I would contact her back.

## 2017-04-24 NOTE — Telephone Encounter (Signed)
Called back and left message on machine to call me.

## 2017-04-28 ENCOUNTER — Other Ambulatory Visit: Payer: Self-pay | Admitting: Neurology

## 2017-04-28 MED ORDER — PIMAVANSERIN TARTRATE 17 MG PO TABS: 2.0000 | ORAL_TABLET | Freq: Every day | ORAL | 1 refills | Status: DC

## 2017-06-04 ENCOUNTER — Other Ambulatory Visit: Payer: Self-pay | Admitting: Neurology

## 2017-06-04 NOTE — Progress Notes (Addendum)
Manuel Jackson was seen today in the movement disorders clinic for neurologic consultation at the request of [name].  His PCP is [name], MD.  The patient is seen today in neurologic consultation, accompanied by his wife who supplements the history.  He has been taken care of for many years by [name], but the drive to [facility] has become long and cumbersome for him and he is trying to find a neurologist closer to home.  I have reviewed and appreciated the records from [name].  He first saw [name] and was diagnosed with Parkinson's disease in approximately 2008, when [name] was with [facility] neurology.  He followed [name] to [facility].  The patient's first symptom was right hand rest tremor.  He was started on Azilect first and Mirapex came not long thereafter.  He has been on levodopa since approximately 2011.  The patient felt that each of these medications helped either tremor or balance or both at the initiation of the medication, but is having more difficulty now with freezing and on/off.  He developed hallucinations in approximately May, 2013 and did not fully respond to quetiapine.  He saw [name] for a one-time consultation in 2013 and [name] decided to taper him off of Mirapex.  Unfortunately, the patient became very stiff off of Mirapex and he ended up going back on the drug and is now back on it at pramipexole, 0.25 mg 4 times per day.  He is also on the Exelon patch 9.5 mg daily, Namenda 10 mg twice a day as well as his Seroquel 25 mg bid; he is still having hallucinations despite this.  As above, he has had more difficulties with motor fluctuations and called [name] in June and his dose of carbidopa/levodopa was adjusted slightly so that he is now taking 2 tablets at 8 AM/2 at noon/1 tablet at 2 PM/1 tablet at 4 PM/2 tablets at 6:30 PM and one tablet at bedtime.  Pt states that his medication works right after he takes the medication but by 11 am he  is frozen.  He states that medication wears out after 3 hours.  His wife states that she has been taking the 2 tablets at noon and starting giving one at 10 am and one at noon and that works better.  Interestingly, he wakes up at 6 am but doesn't take first med until 8am.     09/28/15 update:  The patient is following up today, accompanied by his wife who supplements the history.  He has a history of Parkinson's disease with Parkinson's disease dementia.  He is on carbidopa/levodopa 25/100, 2 tablets in the morning and 7 other tablets spread evenly throughout the day.  Last visit we added carbidopa/levodopa 50/200 at nighttime.  Despite attempts in the past by his previous physicians to get him off of Mirapex, he ended up going back on it and is on Mirapex, 0.25 mg 4 times per day.  He is on Seroquel 25 mg twice a day and last visit we discussed adding Nuplazid.  We also discussed the possibilities of duopa because of motor fluctuations.  He is on Exelon patch, 9.5 mg daily and Namenda, 10 mg twice a day.  He continues to have hallucinations.  He has had a few falls since last visit and the days that he falls are the days that he has not slept the night before.  He still isn't sleeping well.  He refuses to  use a walker or ambulatory assistive device.  He has a pressure sore.  His wife states that she thinks that he got it from when she had to pull him by his heels across the floor to move him when he was frozen.  She states that it is almost healed now.  He had graduated from PT but has restarted it yesterday.    11/14/15 update:  The patient has a history of Parkinson's disease with Parkinson's disease dementia.  He is on carbidopa/levodopa 25/100, 2 tablets in the morning, followed by 7 other dosages spread evenly throughout the day.  He is also on carbidopa/levodopa 50/200 at nighttime.  His wife will occasionally give him an extra levodopa (she did today because she knew she was coming here and he would  freeze).  Last visit, his quetiapine was changed from 25 mg twice a day to 50 mg at night.  This was primarily because of issues with insomnia. He is now going to bed at 8:30 pm and staying in bed without awakening until 7am.   We did, however, last visit talk some about Nuplazid.  This is because of hallucinations.  Also because of this, I dropped his pramipexole from 0.25 mg 4 times a day to 0.25 mg twice per day. Wife states that this is much better.  Rarely having hallucinations now.  Did not seem to have any bad consequences from dropping the pramipexole to a lower dosage.  I have been doing this cautiously because he was unable to get off of that in the past.  He does have a history of Parkinson's disease dementia and is on the Exelon patch as well as Namenda, 10 mg twice a day.  His wife brings me a picture of his pressure wound on his right buttocks and reports he is still doing wound care but it is getting better.  He is exercising on his cardiofit and has gone back to the [employer].  He fell twice since last visit; one time fell forward out of the chair while eating.  He didn't get hurt.  With the other, he was in the bathroom and didn't have his walker.  He was hanging his towel up.  Wife asks me about him doing a colonoscopy.  States that his primary care physician would like him to do that.  She is reluctant.  03/19/16 update:  The patient has a history of Parkinson's disease with Parkinson's disease dementia.  This patient is accompanied in the office by his spouse who supplements the history.  He is on carbidopa/levodopa 25/100, 2 tablets in the morning, followed by 7 other dosages spread evenly throughout the day.  He is also on carbidopa/levodopa 50/200 at nighttime.  I tried to wean him off of the mirapex but his wife states that he "couldn't do it" and he is only on 0.25 mg bid.    He remains on azilect.  He is on quetiapine 50 mg at night.  This was primarily because of issues with insomnia, which he  is still having.  We did, however, last visit talk some about Nuplazid for hallucinations but have held on that thus far.  He states that hallucinations have gotten a bit worse.  Wife states that was really bad when she was giving him tylenol PM but she stopped that and is giving melatonin for insomnia now.   He does have a history of Parkinson's disease dementia and is on the Exelon patch as well as Namenda, 10 mg  twice a day.  He is exercising several times a week at the [facility], doing biking.  Has had multiple falls.  Multiple episodes of freezing.  However, doesn't like to use the walker at home.    06/20/16 update:  The patient has a history of Parkinson's disease with Parkinson's disease dementia.  This patient is accompanied in the office by his spouse who supplements the history.  He is on carbidopa/levodopa 25/100, 2 tablets in the morning, followed by 7 other dosages spread evenly throughout the day.  He is also on carbidopa/levodopa 50/200 at nighttime.  He is also still on low-dose pramipexole at 0.25 mg twice a day.  Despite trying to wean this off in the past, he was not able to get off of it and went back on it.  He is on Azilect, 1 mg daily.  He has Parkinson's disease dementia and is on Exelon Patch and Namenda.  Last visit, after checking his QT interval which was normal, we added Nuplazid for hallucinations.  He did well after a couple of weeks, and then began to have some freezing and his wife thought that this was medication related.  However, he also had a pressure ulcer and urinary urgency at the time.  I urged him to get checked by primary care before we went off of the medication.  They did not call back since that time.  He is also on very low-dose Seroquel for insomnia.  Pt and wife are not sure if the nuplazid it is helpful but think that maybe it has be (but really aren't sure).  2 episodes of freezing so much that he had to go to the ER in [place].  State that the ER did not  help them but they weren't sure what else to do.  Has at least 4 hours of off time per day if not more.  09/24/16 update:  Pt is f/u today, accompanied by his wife who supplements the history.  I sent him for a c/s for GJ tube placement for administration of duopa.  Felt to be a good candidate but patient has not yet proceeded with the procedure.  States today that he has decided that he doesn't want to go through with the procedure. He is still on carbidopa/levodopa 25/100, 2 in the AM followed by 7 other dosages during the day and then carbidopa/levodopa 50/200 at night.  He is on pramipexole 0.25 mg bid.  Still on nuplazid 34 mg daily for hallucinations.  His wife states that he is much better and "she can reason with him much better if he has the hallucinations."   He is on seroquel at low dose as well, and pt/wife understand risks of combining these meds.  He is having no SE of exelon/namenda although wife not sure that they help.  Memory has slowly been deteriorating for quite some time.  He is biking at the [facility] [place] and has lost 20 lbs that way.  That has helped him and today was one of the first freezing episodes he has had.  Sleeping better.  He had 3 falls since last visit.  2 were at the [facility] and he was walking and just fell forward and he got right back up without getting up.  Last fall was last week and it was at the mall in [place].    12/26/16 update:  Pt f/u today.  This patient is accompanied in the office by his spouse who supplements the history.  No taking carbidopa/levodopa 25/100, 2 po tid and then carbidopa/levodopa 50/200 at night.   On both nuplazid, 34 mg and seroquel, 50 mg.  Wife understands that these are both atypical antipsychotics and understands risk of combining these meds but thinks that quality of life with these is most important.  Has done much better with hallucinations and only has them in the car and thinks that head rests in the car are people.  No falls at all.   Wife states that he has really has devoted to exercise and has been moving better and able to stay up later now.  Has trouble getting in and out of the bed and wife has to help him with this.  Wife has to life the legs.   On 0.25 mg bid of pramipexole.  Tried several times to d/c without success.  Exercising at [facility].  No lightheadedness or near syncope.  Mood is good.  He did get lost on one occasion.  Wife left him just for a second and wondered to neighbors  06/05/17 update:  Patient seen today in follow-up.  He is accompanied by his wife who supplements the history.  I have reviewed records since last visit.  He is now on carbidopa/levodopa 25/100, 2 tablets 4 times per day (7:30am/11am/2pm/5:30) and carbidopa/levodopa 50/200 at night.  Noted more freezing.  His wife will given him an extra 1/2 as needed but more often than not he has needed it.   His Nuplazid was approved since last visit and he remains on that, 34 mg daily.  He is also on quetiapine, 50 mg daily.  I have tried to get him off of pramipexole because of his issues with cognition, but he went back on it.  He is only on 0.25 mg twice per day.  Wife did call in June because of increased falls, but he was on codeine at the time.  I suspected that that was the etiology.  Wife agrees and when the codeine was d/c falls were better.  Hallucinations were also worse on the codeine and not back to baseline  PREVIOUS MEDICATIONS: Sinemet, Mirapex and Seroquel; he tried klonopin - 0.5 mg - full tablet at bedtime but seemed to "make worse"  ALLERGIES:   Allergies  Allergen Reactions  . Naproxen Swelling    CURRENT MEDICATIONS:  Outpatient Encounter Prescriptions as of 06/05/2017  Medication Sig  . carbidopa-levodopa (SINEMET CR) 50-200 MG tablet TAKE 1 TABLET BY MOUTH AT BEDTIME.  . carbidopa-levodopa (SINEMET IR) 25-100 MG tablet 2 tablets TID and 1 PRN daily  . cholecalciferol (VITAMIN D) 1000 UNITS tablet Take 1,000 Units by mouth daily.  Marland Kitchen  escitalopram (LEXAPRO) 10 MG tablet Take 10 mg by mouth daily with supper.  . hydrochlorothiazide (HYDRODIURIL) 25 MG tablet Take 25 mg by mouth daily.   . memantine (NAMENDA) 10 MG tablet Take 1 tablet (10 mg total) by mouth daily.  Marland Kitchen Pimavanserin Tartrate (NUPLAZID) 17 MG TABS Take 2 tablets by mouth daily.  . pramipexole (MIRAPEX) 0.5 MG tablet TAKE 1/2 TABLET TWICE A DAY WITH 8AM LEVODOPA AND 8PM CR LEVODOPA  . QUEtiapine (SEROQUEL) 25 MG tablet TAKE 2 TABLETS (50 MG TOTAL) BY MOUTH AT BEDTIME.  . rivastigmine (EXELON) 9.5 mg/24hr PLACE 1 PATCH (9.5 MG TOTAL) ONTO THE SKIN DAILY.   No facility-administered encounter medications on file as of 06/05/2017.     PAST MEDICAL HISTORY:   Past Medical History:  Diagnosis Date  . Hernia of abdominal cavity   .  Parkinson's disease (HCC)     PAST SURGICAL HISTORY:   Past Surgical History:  Procedure Laterality Date  . HERNIA REPAIR    . IR GENERIC HISTORICAL  08/20/2016   IR RADIOLOGIST EVAL & MGMT 08/20/2016 Richarda Overlie, MD [facility] INTERV RAD    SOCIAL HISTORY:   Social History   Social History  . Marital status: Married    Spouse name: N/A  . Number of children: N/A  . Years of education: N/A   Occupational History  . Not on file.   Social History Main Topics  . Smoking status: Former Games developer  . Smokeless tobacco: Never Used  . Alcohol use 0.0 oz/week  . Drug use: No  . Sexual activity: Not on file   Other Topics Concern  . Not on file   Social History Narrative   Lives with wife in story home.  Has no children.  Retired Dietitian for [employer].  Education: associate's degree.    FAMILY HISTORY:   Family Status  Relation Status  . Mother Deceased  . Father Deceased  . Brother Deceased    ROS:  A complete 10 system review of systems was obtained and was unremarkable apart from what is mentioned above.  PHYSICAL EXAMINATION:    VITALS:   There were no vitals filed for this visit. Wt Readings from Last 3  Encounters:  12/26/16 158 lb (71.7 kg)  08/20/16 150 lb (68 kg)  06/20/16 153 lb (69.4 kg)     GEN:  The patient appears stated age and is in NAD. HEENT:  Normocephalic, atraumatic.  The mucous membranes are moist. The superficial temporal arteries are without ropiness or tenderness. CV:  RRR Lungs:  CTAB Neck/HEME:  There are no carotid bruits bilaterally.  Neurological examination:  Orientation: He scored an 8/30 on his MoCA previously.  The patient is alert and oriented x2 today.   Cranial nerves: There is good facial symmetry.  There is significant facial hypomimia.    The speech is fluent and clear. Soft palate rises symmetrically and there is no tongue deviation. Hearing is intact to conversational tone. Sensation: Sensation is intact to light touch throughout. Motor: Strength is 5/5 in the bilateral upper and lower extremities.   Shoulder shrug is equal and symmetric.  There is no pronator drift.   Movement examination: Tone: There is normal tone in the bilateral upper extremities.  The tone in the lower extremities is normal.  Abnormal movements: There is dyskinesia in the LLE extremity Coordination:  There is mild decremation with RAM's, with all form of RAMS, including alternating supination and pronation of the forearm, hand opening and closing, finger taps, heel taps and toe taps bilaterally.  Gait and Station: The patient has no difficulty arising out of his chair for me, and had very minimal start hesitation.  He has dyskinetic arm swing  Labs:    Chemistry      Component Value Date/Time   NA 141 04/04/2017 1340   K 3.3 (L) 04/04/2017 1340   CL 103 04/04/2017 1340   CO2 25 04/04/2017 1340   BUN 19 04/04/2017 1340   CREATININE 0.87 04/04/2017 1340      Component Value Date/Time   CALCIUM 9.4 04/04/2017 1340   ALKPHOS 70 04/04/2017 1340   AST 18 04/04/2017 1340   ALT 6 04/04/2017 1340   BILITOT 0.5 04/04/2017 1340     Lab Results  Component Value Date    WBC 9.4 04/04/2017   HGB 14.1 04/04/2017  HCT 42.3 04/04/2017   MCV 98.5 04/04/2017   PLT 329.0 04/04/2017   ADDENDUM LABS:  Labs were received and dated 07/04/2017.  White blood cells were 9.0, hemoglobin 14.8, hematocrit 43.1 and platelets 275.  Sodium was 140, potassium 3.7, chloride 104, CO2 28, BUN 16 and creatinine 0.78 and glucose 114.  AST was 11, ALT less than 3, alkaline phosphatase 69.  ASSESSMENT/PLAN:  1.  Parkinsons disease with advanced Parkinsons disease dementia  -will change dosing of medication and he will take carbidopa/levodopa 25/100 as follows: 2 tablets at 7am/10am/1pm/4pm and then if he needs an extra 1/2 to 1 tablet at 7pm, he can take that.    -Continue carbidopa/levodopa 50/200 CR at bedtime  -Tried to wean mirapex but pt went back on.  Only on 0.25 mg bid but insists it helps.  I am going to d/c it again because of worsening hallucinations and confusion  -talked again about duopa.  He had a consult and was felt to be a good candidate.  He even met with a patient representative, but ultimately the patient decided against having it.    -Congratulated the patient on all of the exercises he is doing.  He has lost a significant amount of weight with exercise and states that he really is feeling good.  He feels that the exercises helping dramatically with the freezing and with his Parkinson's disease in general.  -He is now off of Azilect. 2.  RBD  -has tried klonopin but thinks that he had SE (today states that he just thinks that it didn't work).  Will need to monitor.  If need in future, will try 1/2 of the tablet or even less.   3.  PDD  -continue exelon patch 9.5 mg daily  -on namenda 10 mg bid.   -home safety discussed in detail.  Shouldn't be left alone.  -Patient's wife thinks that Nuplazid, 34 mg, is helping.    Patient and his wife understand the blackbox warning.  Understand that it, especially along with Seroquel, can prolong QT interval.  They understand  what this means. 4.  Insomnia  -on 50 mg seroquel and melatonin  -Talked to them about the interaction between Nuplazid and Seroquel.  Talked about the fact that they are both atypical antipsychotic medications.  Both of these have a black box warning.  Discussed with the blackbox warning was in detail with the patient and his wife.  He does not have prolonged QT.  The patient and his wife decided that the benefits outweigh the risks.  Continue both for now 5.  Follow up in 3-4 months.  Much greater than 50% of this visit was spent in counseling and coordinating care.  Total face to face time:  35 min

## 2017-06-05 ENCOUNTER — Encounter: Payer: Self-pay | Admitting: Neurology

## 2017-06-05 ENCOUNTER — Ambulatory Visit (INDEPENDENT_AMBULATORY_CARE_PROVIDER_SITE_OTHER): Payer: Medicare Other | Admitting: Neurology

## 2017-06-05 VITALS — BP 116/58 | HR 63 | Ht 67.0 in | Wt 161.0 lb

## 2017-06-05 DIAGNOSIS — F0280 Dementia in other diseases classified elsewhere without behavioral disturbance: Secondary | ICD-10-CM | POA: Diagnosis not present

## 2017-06-05 DIAGNOSIS — G20 Parkinson's disease: Secondary | ICD-10-CM | POA: Diagnosis not present

## 2017-06-05 DIAGNOSIS — R441 Visual hallucinations: Secondary | ICD-10-CM

## 2017-06-05 DIAGNOSIS — G20A1 Parkinson's disease without dyskinesia, without mention of fluctuations: Secondary | ICD-10-CM

## 2017-06-05 NOTE — Patient Instructions (Addendum)
Take carbidopa/levodopa 25/100 as follows: 2 tablets at 7am/10am/1pm/4pm and then if you need an extra 1/2 to 1 tablet at 7pm, you can take that.  Still take the carbidopa/levodopa 50/200 at 8pm  Take pramipexole - 0.5 mg - 1/2 tablet in the AM for a week and then stop the medication

## 2017-06-06 ENCOUNTER — Ambulatory Visit: Payer: Medicare Other | Admitting: Neurology

## 2017-06-13 ENCOUNTER — Other Ambulatory Visit: Payer: Self-pay | Admitting: Neurology

## 2017-06-13 ENCOUNTER — Telehealth: Payer: Self-pay | Admitting: Neurology

## 2017-06-13 MED ORDER — PIMAVANSERIN TARTRATE 34 MG PO CAPS: 1.0000 | ORAL_CAPSULE | Freq: Every day | ORAL | 1 refills | Status: DC

## 2017-06-13 NOTE — Telephone Encounter (Signed)
Per DPR left detailed message on patient making patient aware Nuplazid is being converted to once a day tablet instead of two tablets once a day. RX has already been sent.

## 2017-06-17 ENCOUNTER — Telehealth: Payer: Self-pay | Admitting: Neurology

## 2017-06-17 NOTE — Telephone Encounter (Signed)
Submitted auth request via covermymeds.

## 2017-06-17 NOTE — Telephone Encounter (Signed)
[facility] called needing to get Pre Authorization on the Medication Nuplazid. The pharmacy will be sending over a fax. Thanks

## 2017-06-28 ENCOUNTER — Other Ambulatory Visit: Payer: Self-pay | Admitting: Neurology

## 2017-07-11 ENCOUNTER — Telehealth: Payer: Self-pay | Admitting: Neurology

## 2017-07-11 NOTE — Telephone Encounter (Signed)
Called and spoke with Pts wife, Tobi Bastos. Pt is actually still I/P in [facility], she wanted to make Dr Tat aware of Pts condition, Since initial call, has been told is probably a UTI. Advsd her to ask that the PCP is sent notes of admission.

## 2017-07-11 NOTE — Telephone Encounter (Signed)
Patient went to the hospital last and was admitted patient was shaking uncontrollable and this morning he could not walk and is very confused he had a fever of 102.6 last night  And he still is running a fever

## 2017-07-19 ENCOUNTER — Other Ambulatory Visit: Payer: Self-pay | Admitting: Neurology

## 2017-08-18 ENCOUNTER — Telehealth: Payer: Self-pay | Admitting: Neurology

## 2017-08-18 NOTE — Telephone Encounter (Signed)
Patient's wife called saying that she needs to speak with Lesly RubensteinJade regarding her husband and having trouble with him. Please call. Thanks

## 2017-08-19 NOTE — Telephone Encounter (Signed)
Spoke with patient's wife.  Patient has having a lot of issues with frequent UTI's and trouble emptying bladder. He was fine sent to Dr. Leanord Hawkingobson, [place]rolog[place] in [place], and was put on myrbetriq and vesicare together. He also just finished a round of antibiotics. He is finally doing better as far as his bladder, but is having a lot of falls and confusion.   I did let his wife know this was fairly common with bladder medication to have these symptoms.  Dr. Arbutus Leasat please advise if anything to do.

## 2017-08-19 NOTE — Telephone Encounter (Signed)
I'm sure that the bladder is not helping his dementia but probably not much that can be done.  Can we get him a walker/wheelchair for the falls?

## 2017-08-19 NOTE — Telephone Encounter (Signed)
Patient's wife made aware. He is using a walker and a wheelchair at times. Encouraged to use this to help with falls. Will call with any other questions.

## 2017-09-30 ENCOUNTER — Other Ambulatory Visit: Payer: Self-pay | Admitting: Neurology

## 2017-10-01 ENCOUNTER — Telehealth: Payer: Self-pay | Admitting: Neurology

## 2017-10-01 NOTE — Telephone Encounter (Signed)
Patient's wife called regarding his Medication and the way it is written he is seeming to run out of it quicker. Please Call. Thanks

## 2017-10-01 NOTE — Telephone Encounter (Signed)
Left message on machine for patient to call back.

## 2017-10-02 MED ORDER — CARBIDOPA-LEVODOPA 25-100 MG PO TABS: ORAL_TABLET | ORAL | 5 refills | Status: DC

## 2017-10-02 NOTE — Telephone Encounter (Signed)
Received VM that patient takes Levodopa 2 QID with 1 extra PRN. Last RX states 2 tablets TID. RX changed and sent to pharmacy.

## 2017-10-02 NOTE — Telephone Encounter (Signed)
Patient's wife made aware.

## 2017-10-07 ENCOUNTER — Other Ambulatory Visit: Payer: Self-pay | Admitting: Neurology

## 2017-10-07 NOTE — Progress Notes (Signed)
Manuel Jackson was seen today in the movement disorders clinic for neurologic consultation at the request of Dr. Doy Jackson.  His PCP is Manuel Arabian, MD.  The patient is seen today in neurologic consultation, accompanied by his wife who supplements the history.  He has been taken care of for many years by Dr. Doy Jackson, but the drive to [facility] has become long and cumbersome for him and he is trying to find a neurologist closer to home.  I have reviewed and appreciated the records from Dr. Doy Jackson.  He first saw Dr. Doy Jackson and was diagnosed with Parkinson's disease in approximately 2008, when Dr. Doy Jackson was with [facility] neurology.  He followed Dr. Doy Jackson to [facility].  The patient's first symptom was right hand rest tremor.  He was started on Azilect first and Mirapex came not long thereafter.  He has been on levodopa since approximately 2011.  The patient felt that each of these medications helped either tremor or balance or both at the initiation of the medication, but is having more difficulty now with freezing and on/off.  He developed hallucinations in approximately May, 2013 and did not fully respond to quetiapine.  He saw Dr. Maxine Jackson for a one-time consultation in 2013 and Dr. Maxine Jackson decided to taper him off of Mirapex.  Unfortunately, the patient became very stiff off of Mirapex and he ended up going back on the drug and is now back on it at pramipexole, 0.25 mg 4 times per day.  He is also on the Exelon patch 9.5 mg daily, Namenda 10 mg twice a day as well as his Seroquel 25 mg bid; he is still having hallucinations despite this.  As above, he has had more difficulties with motor fluctuations and called Dr. Doy Jackson in June and his dose of carbidopa/levodopa was adjusted slightly so that he is now taking 2 tablets at 8 AM/2 at noon/1 tablet at 2 PM/1 tablet at 4 PM/2 tablets at 6:30 PM and one tablet at bedtime.  Pt states that his medication works right after he takes the medication but by 11 am he  is frozen.  He states that medication wears out after 3 hours.  His wife states that she has been taking the 2 tablets at noon and starting giving one at 10 am and one at noon and that works better.  Interestingly, he wakes up at 6 am but doesn't take first med until 8am.     09/28/15 update:  The patient is following up today, accompanied by his wife who supplements the history.  He has a history of Parkinson's disease with Parkinson's disease dementia.  He is on carbidopa/levodopa 25/100, 2 tablets in the morning and 7 other tablets spread evenly throughout the day.  Last visit we added carbidopa/levodopa 50/200 at nighttime.  Despite attempts in the past by his previous physicians to get him off of Mirapex, he ended up going back on it and is on Mirapex, 0.25 mg 4 times per day.  He is on Seroquel 25 mg twice a day and last visit we discussed adding Nuplazid.  We also discussed the possibilities of duopa because of motor fluctuations.  He is on Exelon patch, 9.5 mg daily and Namenda, 10 mg twice a day.  He continues to have hallucinations.  He has had a few falls since last visit and the days that he falls are the days that he has not slept the night before.  He still isn't sleeping well.  He refuses to  use a walker or ambulatory assistive device.  He has a pressure sore.  His wife states that she thinks that he got it from when she had to pull him by his heels across the floor to move him when he was frozen.  She states that it is almost healed now.  He had graduated from PT but has restarted it yesterday.    11/14/15 update:  The patient has a history of Parkinson's disease with Parkinson's disease dementia.  He is on carbidopa/levodopa 25/100, 2 tablets in the morning, followed by 7 other dosages spread evenly throughout the day.  He is also on carbidopa/levodopa 50/200 at nighttime.  His wife will occasionally give him an extra levodopa (she did today because she knew she was coming here and he would  freeze).  Last visit, his quetiapine was changed from 25 mg twice a day to 50 mg at night.  This was primarily because of issues with insomnia. He is now going to bed at 8:30 pm and staying in bed without awakening until 7am.   We did, however, last visit talk some about Nuplazid.  This is because of hallucinations.  Also because of this, I dropped his pramipexole from 0.25 mg 4 times a day to 0.25 mg twice per day. Wife states that this is much better.  Rarely having hallucinations now.  Did not seem to have any bad consequences from dropping the pramipexole to a lower dosage.  I have been doing this cautiously because he was unable to get off of that in the past.  He does have a history of Parkinson's disease dementia and is on the Exelon patch as well as Namenda, 10 mg twice a day.  His wife brings me a picture of his pressure wound on his right buttocks and reports he is still doing wound care but it is getting better.  He is exercising on his cardiofit and has gone back to the [employer].  He fell twice since last visit; one time fell forward out of the chair while eating.  He didn't get hurt.  With the other, he was in the bathroom and didn't have his walker.  He was hanging his towel up.  Wife asks me about him doing a colonoscopy.  States that his primary care physician would like him to do that.  She is reluctant.  03/19/16 update:  The patient has a history of Parkinson's disease with Parkinson's disease dementia.  This patient is accompanied in the office by his spouse who supplements the history.  He is on carbidopa/levodopa 25/100, 2 tablets in the morning, followed by 7 other dosages spread evenly throughout the day.  He is also on carbidopa/levodopa 50/200 at nighttime.  I tried to wean him off of the mirapex but his wife states that he "couldn't do it" and he is only on 0.25 mg bid.    He remains on azilect.  He is on quetiapine 50 mg at night.  This was primarily because of issues with insomnia, which he  is still having.  We did, however, last visit talk some about Nuplazid for hallucinations but have held on that thus far.  He states that hallucinations have gotten a bit worse.  Wife states that was really bad when she was giving him tylenol PM but she stopped that and is giving melatonin for insomnia now.   He does have a history of Parkinson's disease dementia and is on the Exelon patch as well as Namenda, 10 mg  twice a day.  He is exercising several times a week at the [facility], doing biking.  Has had multiple falls.  Multiple episodes of freezing.  However, doesn't like to use the walker at home.    06/20/16 update:  The patient has a history of Parkinson's disease with Parkinson's disease dementia.  This patient is accompanied in the office by his spouse who supplements the history.  He is on carbidopa/levodopa 25/100, 2 tablets in the morning, followed by 7 other dosages spread evenly throughout the day.  He is also on carbidopa/levodopa 50/200 at nighttime.  He is also still on low-dose pramipexole at 0.25 mg twice a day.  Despite trying to wean this off in the past, he was not able to get off of it and went back on it.  He is on Azilect, 1 mg daily.  He has Parkinson's disease dementia and is on Exelon Patch and Namenda.  Last visit, after checking his QT interval which was normal, we added Nuplazid for hallucinations.  He did well after a couple of weeks, and then began to have some freezing and his wife thought that this was medication related.  However, he also had a pressure ulcer and urinary urgency at the time.  I urged him to get checked by primary care before we went off of the medication.  They did not call back since that time.  He is also on very low-dose Seroquel for insomnia.  Pt and wife are not sure if the nuplazid it is helpful but think that maybe it has be (but really aren't sure).  2 episodes of freezing so much that he had to go to the ER in [place].  State that the ER did not  help them but they weren't sure what else to do.  Has at least 4 hours of off time per day if not more.  09/24/16 update:  Pt is f/u today, accompanied by his wife who supplements the history.  I sent him for a c/s for GJ tube placement for administration of duopa.  Felt to be a good candidate but patient has not yet proceeded with the procedure.  States today that he has decided that he doesn't want to go through with the procedure. He is still on carbidopa/levodopa 25/100, 2 in the AM followed by 7 other dosages during the day and then carbidopa/levodopa 50/200 at night.  He is on pramipexole 0.25 mg bid.  Still on nuplazid 34 mg daily for hallucinations.  His wife states that he is much better and "she can reason with him much better if he has the hallucinations."   He is on seroquel at low dose as well, and pt/wife understand risks of combining these meds.  He is having no SE of exelon/namenda although wife not sure that they help.  Memory has slowly been deteriorating for quite some time.  He is biking at the [facility] [place] and has lost 20 lbs that way.  That has helped him and today was one of the first freezing episodes he has had.  Sleeping better.  He had 3 falls since last visit.  2 were at the [facility] and he was walking and just fell forward and he got right back up without getting up.  Last fall was last week and it was at the mall in [place].    12/26/16 update:  Pt f/u today.  This patient is accompanied in the office by his spouse who supplements the history.  No taking carbidopa/levodopa 25/100, 2 po tid and then carbidopa/levodopa 50/200 at night.   On both nuplazid, 34 mg and seroquel, 50 mg.  Wife understands that these are both atypical antipsychotics and understands risk of combining these meds but thinks that quality of life with these is most important.  Has done much better with hallucinations and only has them in the car and thinks that head rests in the car are people.  No falls at all.   Wife states that he has really has devoted to exercise and has been moving better and able to stay up later now.  Has trouble getting in and out of the bed and wife has to help him with this.  Wife has to life the legs.   On 0.25 mg bid of pramipexole.  Tried several times to d/c without success.  Exercising at [facility].  No lightheadedness or near syncope.  Mood is good.  He did get lost on one occasion.  Wife left him just for a second and wondered to neighbors  06/05/17 update:  Patient seen today in follow-up.  He is accompanied by his wife who supplements the history.  I have reviewed records since last visit.  He is now on carbidopa/levodopa 25/100, 2 tablets 4 times per day (7:30am/11am/2pm/5:30) and carbidopa/levodopa 50/200 at night.  Noted more freezing.  His wife will given him an extra 1/2 as needed but more often than not he has needed it.   His Nuplazid was approved since last visit and he remains on that, 34 mg daily.  He is also on quetiapine, 50 mg daily.  I have tried to get him off of pramipexole because of his issues with cognition, but he went back on it.  He is only on 0.25 mg twice per day.  Wife did call in June because of increased falls, but he was on codeine at the time.  I suspected that that was the etiology.  Wife agrees and when the codeine was d/c falls were better.  Hallucinations were also worse on the codeine and not back to baseline  10/09/17 update:  Pt seen in f/u.  The records that were made available to me were reviewed.  This patient is accompanied in the office by his spouse who supplements the history.On carbidopa/levodopa 25/100, 2 po qid and carbidopa/levodopa 50/200 q hs.  On seroquel 50 mg at night in addition to nuplazid (risks known).  Pt/wife state that since last visit confusion has been worse, but hallucinations have actually been better.  This is primarily due to UTI.  He was hospitalized for this in September.  Put on myrbetriq and vesicare in October.  I reviewed  his [place] urology records.  The vesicare was d/c due to more confusion.  Wife states that the UTI's generated confusion.  This is better somewhat.  Numerous falls due to more freezing with UTI's.  Hit head with falls but doing ok.  Refuses to use walker and he tripped over the curb.  Patient refuses adult day care at the [facility].  Burden of caregiving is growing for wife.  PREVIOUS MEDICATIONS: Sinemet, Mirapex and Seroquel; he tried klonopin - 0.5 mg - full tablet at bedtime but seemed to "make worse"; vesicare (increased confusion)  ALLERGIES:   Allergies  Allergen Reactions  . Naproxen Swelling    CURRENT MEDICATIONS:  Outpatient Encounter Medications as of 10/09/2017  Medication Sig  . carbidopa-levodopa (SINEMET CR) 50-200 MG tablet TAKE 1 TABLET BY MOUTH AT BEDTIME.  Marland Kitchen  carbidopa-levodopa (SINEMET IR) 25-100 MG tablet 2 tablets QID and 1 PRN daily  . escitalopram (LEXAPRO) 10 MG tablet Take 10 mg by mouth daily with supper.  . hydrochlorothiazide (HYDRODIURIL) 25 MG tablet Take 25 mg by mouth daily.   . memantine (NAMENDA) 10 MG tablet TAKE 1 TABLET (10 MG TOTAL) BY MOUTH 2 (TWO) TIMES DAILY.  . mirabegron ER (MYRBETRIQ) 50 MG TB24 tablet Take 50 mg by mouth daily.  Marland Kitchen Pimavanserin Tartrate (NUPLAZID) 34 MG CAPS Take 1 capsule by mouth daily.  . QUEtiapine (SEROQUEL) 25 MG tablet TAKE 2 TABLETS (50 MG TOTAL) BY MOUTH AT BEDTIME.  . rivastigmine (EXELON) 9.5 mg/24hr PLACE 1 PATCH (9.5 MG TOTAL) ONTO THE SKIN DAILY.  . [DISCONTINUED] carbidopa-levodopa (SINEMET CR) 50-200 MG tablet TAKE 1 TABLET BY MOUTH AT BEDTIME.  . [DISCONTINUED] cholecalciferol (VITAMIN D) 1000 UNITS tablet Take 1,000 Units by mouth daily.  . [DISCONTINUED] pramipexole (MIRAPEX) 0.5 MG tablet TAKE 1/2 TABLET TWICE A DAY WITH 8AM LEVODOPA AND 8PM CR LEVODOPA   No facility-administered encounter medications on file as of 10/09/2017.     PAST MEDICAL HISTORY:   Past Medical History:  Diagnosis Date  .  Hernia of abdominal cavity   . Parkinson's disease ([place])     PAST SURGICAL HISTORY:   Past Surgical History:  Procedure Laterality Date  . HERNIA REPAIR    . IR GENERIC HISTORICAL  08/20/2016   IR RADIOLOGIST EVAL & MGMT 08/20/2016 Markus Daft, MD [facility] INTERV RAD    SOCIAL HISTORY:   Social History   Socioeconomic History  . Marital status: Married    Spouse name: Not on file  . Number of children: Not on file  . Years of education: Not on file  . Highest education level: Not on file  Social Needs  . Financial resource strain: Not on file  . Food insecurity - worry: Not on file  . Food insecurity - inability: Not on file  . Transportation needs - medical: Not on file  . Transportation needs - non-medical: Not on file  Occupational History  . Not on file  Tobacco Use  . Smoking status: Former Research scientist (life sciences)  . Smokeless tobacco: Never Used  Substance and Sexual Activity  . Alcohol use: Yes    Alcohol/week: 0.0 oz  . Drug use: No  . Sexual activity: Not on file  Other Topics Concern  . Not on file  Social History Narrative   Lives with wife in story home.  Has no children.  Retired Geophysical data processor for [employer]  Education: associate's degree.    FAMILY HISTORY:   Family Status  Relation Name Status  . Mother  Deceased  . Father  Deceased  . Brother  Deceased    ROS:  A complete 10 system review of systems was obtained and was unremarkable apart from what is mentioned above.  PHYSICAL EXAMINATION:    VITALS:   Vitals:   10/09/17 1424  BP: 118/70  Pulse: 65  SpO2: 98%   Wt Readings from Last 3 Encounters:  06/05/17 161 lb (73 kg)  12/26/16 158 lb (71.7 kg)  08/20/16 150 lb (68 kg)     GEN:  The patient appears stated age and is in NAD. HEENT:  Normocephalic, atraumatic.  The mucous membranes are moist. The superficial temporal arteries are without ropiness or tenderness. CV:  RRR Lungs:  CTAB Neck/HEME:  There are no carotid bruits  bilaterally.  Neurological examination:  Orientation: He scored an 8/30 on his  MoCA previously.  He is alert and oriented x2. Cranial nerves: There is good facial symmetry.  There is significant facial hypomimia.    The speech is fluent and clear.  He is hypophonic.  Soft palate rises symmetrically and there is no tongue deviation. Hearing is intact to conversational tone. Sensation: Sensation is intact to light touch throughout. Motor: Strength is at least antigravity x4.   Movement examination: Tone: There is normal tone in the bilateral upper extremities.  The tone in the lower extremities is normal.  Abnormal movements: There is dyskinesia in the LLE extremity Coordination:  There is mild decremation with RAM's, with all form of RAMS, including alternating supination and pronation of the forearm, hand opening and closing, finger taps, heel taps and toe taps bilaterally.  Gait and Station: The patient requires some help out of the chair.  He has significant start hesitation.  He requires significant assistance from the examiner when ambulating.  He has freezing.  He has fairly significant apraxia of gait.  Once back in the room and done testing ambulation, he has to be asked 6-8 times to sit back down in the wheelchair.  He just could not understand the command.  Labs:    Chemistry      Component Value Date/Time   NA 141 04/04/2017 1340   K 3.3 (L) 04/04/2017 1340   CL 103 04/04/2017 1340   CO2 25 04/04/2017 1340   BUN 19 04/04/2017 1340   CREATININE 0.87 04/04/2017 1340      Component Value Date/Time   CALCIUM 9.4 04/04/2017 1340   ALKPHOS 70 04/04/2017 1340   AST 18 04/04/2017 1340   ALT 6 04/04/2017 1340   BILITOT 0.5 04/04/2017 1340     Lab Results  Component Value Date   WBC 9.4 04/04/2017   HGB 14.1 04/04/2017   HCT 42.3 04/04/2017   MCV 98.5 04/04/2017   PLT 329.0 04/04/2017   ADDENDUM LABS:  Labs were received and dated 07/04/2017.  White blood cells were 9.0,  hemoglobin 14.8, hematocrit 43.1 and platelets 275.  Sodium was 140, potassium 3.7, chloride 104, CO2 28, BUN 16 and creatinine 0.78 and glucose 114.  AST was 11, ALT less than 3, alkaline phosphatase 69.  ASSESSMENT/PLAN:  1.  Parkinsons disease with advanced Parkinsons disease dementia  -The patient will continue carbidopa/levodopa 25/100 as follows: 2 tablets at 7am/10am/1pm/4pm and then if he needs an extra 1/2 to 1 tablet at 7pm, he can take that.    -Continue carbidopa/levodopa 50/200 CR at bedtime  -Have successfully weaned the patient off of pramipexole since last visit.  -talked again about duopa.  He had a consult and was felt to be a good candidate.  He even met with a patient representative, but ultimately the patient decided against having it.    -Unfortunately, the patient has stopped exercising.  He has refused to go to adult daycare.  I talked to the patient extensively about this today.  I talked to him about the importance of his wife getting respite care.  I talked to him about at least visiting the adult daycare center.  I am going to have my social worker look into any further respite care opportunities available for his wife.  I invited his wife to the Parkinson's caregiver support group.  -He is now off of Azilect. 2.  RBD  -has tried klonopin but thinks that he had SE (today states that he just thinks that it didn't work).  Will need  to monitor.  If need in future, will try 1/2 of the tablet or even less.   3.  PDD  -continue exelon patch 9.5 mg daily  -on namenda 10 mg bid.   -home safety discussed in detail.  Shouldn't be left alone.  -Patient's wife thinks that Nuplazid, 34 mg, is helping.    Patient and his wife understand the blackbox warning.  Understand that it, especially along with Seroquel, can prolong QT interval.  They understand what this means. 4.  Insomnia  -on 50 mg seroquel and melatonin  -Talked to them about the interaction between Nuplazid and Seroquel.   Talked about the fact that they are both atypical antipsychotic medications.  Both of these have a black box warning.  Discussed with the blackbox warning was in detail with the patient and his wife.  He does not have prolonged QT.  The patient and his wife decided that the benefits outweigh the risks.  Continue both for now 5.  I will see him back in 4-5 months, sooner should new neurologic issues arise.  Much greater than 50% of this visit was spent in counseling and coordinating care.  Total face to face time:  30 min

## 2017-10-09 ENCOUNTER — Encounter: Payer: Self-pay | Admitting: Neurology

## 2017-10-09 ENCOUNTER — Ambulatory Visit (INDEPENDENT_AMBULATORY_CARE_PROVIDER_SITE_OTHER): Payer: Medicare Other | Admitting: Neurology

## 2017-10-09 VITALS — BP 118/70 | HR 65

## 2017-10-09 DIAGNOSIS — G20 Parkinson's disease: Secondary | ICD-10-CM | POA: Diagnosis not present

## 2017-10-09 DIAGNOSIS — F0280 Dementia in other diseases classified elsewhere without behavioral disturbance: Secondary | ICD-10-CM

## 2017-10-09 NOTE — Patient Instructions (Signed)
Parkinson's Caregiver Group   Save the Date: First meeting  will be on November 24, 2017  from 2-3:30  Parkinson's disease can be challenging for caregivers too. Changing  abilities and assuming new roles within the family can cause emotional  upheaval. Meeting with a group of peers who are experiencing similar changes is very beneficial for any caregiver. This professionally led support group will provide a safe place for Parkinson's caregivers to connect, share challenges, seek solutions, learn about resources and strengthen coping skills.    When:  Last Monday of the month (unless date falls on a holiday)  2019 Dates: 1/28, 2/25, 3/25, 4/29, 5/20, 6/24, 7/29, 8/26, 9/30, 10/28, 11/25, 12/30   Location: [facility]                                                                                              [address]                                                                                                 [place], [place] [address]                                                                                      Room 204 Time: 2-3:30   Please call Jessica Thomas, MSW, LCSW at [phone] with any questions.    

## 2017-10-10 ENCOUNTER — Telehealth: Payer: Self-pay | Admitting: Psychology

## 2017-10-10 NOTE — Telephone Encounter (Signed)
Tc with caregiver to come up with a respite plan. Provided respite and caregiver resources in [place]. The plan is that she is going to visit 2-3 of the places and call me back in a week. I will try to identify resources after she identifies any barriers after looking at the respite places. When I met with she and her husband, he agreed to attend a respite program or accept care so his wife can get the respite that she needs. I will wait to hear back from the patient's wife.

## 2017-11-17 ENCOUNTER — Telehealth: Payer: Self-pay | Admitting: Neurology

## 2017-11-17 NOTE — Telephone Encounter (Signed)
Pt's wife called and said pt had eye surgery and they put him to sleep and he had a really bad reaction and would like to talk to Dr Tat about that because they want to do the other eye and she is scared to have it done, pt called 911 over the weekend

## 2017-11-17 NOTE — Telephone Encounter (Signed)
Left message on machine for patient's wife to call back.   

## 2017-12-13 ENCOUNTER — Other Ambulatory Visit: Payer: Self-pay | Admitting: Neurology

## 2017-12-15 ENCOUNTER — Other Ambulatory Visit: Payer: Self-pay | Admitting: Neurology

## 2017-12-19 ENCOUNTER — Other Ambulatory Visit: Payer: Self-pay | Admitting: Neurology

## 2017-12-25 ENCOUNTER — Other Ambulatory Visit: Payer: Self-pay | Admitting: Neurology

## 2018-01-09 NOTE — Progress Notes (Deleted)
Manuel Jackson was seen today in the movement disorders clinic for neurologic consultation at the request of Dr. Doy Jackson.  His PCP is Manuel Arabian, MD.  The patient is seen today in neurologic consultation, accompanied by his wife who supplements the history.  He has been taken care of for many years by Dr. Doy Jackson, but the drive to [facility] has become long and cumbersome for him and he is trying to find a neurologist closer to home.  I have reviewed and appreciated the records from Dr. Doy Jackson.  He first saw Dr. Doy Jackson and was diagnosed with Parkinson's disease in approximately 2008, when Dr. Doy Jackson was with [facility] neurology.  He followed Dr. Doy Jackson to [facility].  The patient's first symptom was right hand rest tremor.  He was started on Azilect first and Mirapex came not long thereafter.  He has been on levodopa since approximately 2011.  The patient felt that each of these medications helped either tremor or balance or both at the initiation of the medication, but is having more difficulty now with freezing and on/off.  He developed hallucinations in approximately May, 2013 and did not fully respond to quetiapine.  He saw Dr. Maxine Jackson for a one-time consultation in 2013 and Dr. Maxine Jackson decided to taper him off of Mirapex.  Unfortunately, the patient became very stiff off of Mirapex and he ended up going back on the drug and is now back on it at pramipexole, 0.25 mg 4 times per day.  He is also on the Exelon patch 9.5 mg daily, Namenda 10 mg twice a day as well as his Seroquel 25 mg bid; he is still having hallucinations despite this.  As above, he has had more difficulties with motor fluctuations and called Dr. Doy Jackson in June and his dose of carbidopa/levodopa was adjusted slightly so that he is now taking 2 tablets at 8 AM/2 at noon/1 tablet at 2 PM/1 tablet at 4 PM/2 tablets at 6:30 PM and one tablet at bedtime.  Pt states that his medication works right after he takes the medication but by 11 am he  is frozen.  He states that medication wears out after 3 hours.  His wife states that she has been taking the 2 tablets at noon and starting giving one at 10 am and one at noon and that works better.  Interestingly, he wakes up at 6 am but doesn't take first med until 8am.     09/28/15 update:  The patient is following up today, accompanied by his wife who supplements the history.  He has a history of Parkinson's disease with Parkinson's disease dementia.  He is on carbidopa/levodopa 25/100, 2 tablets in the morning and 7 other tablets spread evenly throughout the day.  Last visit we added carbidopa/levodopa 50/200 at nighttime.  Despite attempts in the past by his previous physicians to get him off of Mirapex, he ended up going back on it and is on Mirapex, 0.25 mg 4 times per day.  He is on Seroquel 25 mg twice a day and last visit we discussed adding Nuplazid.  We also discussed the possibilities of duopa because of motor fluctuations.  He is on Exelon patch, 9.5 mg daily and Namenda, 10 mg twice a day.  He continues to have hallucinations.  He has had a few falls since last visit and the days that he falls are the days that he has not slept the night before.  He still isn't sleeping well.  He refuses to  use a walker or ambulatory assistive device.  He has a pressure sore.  His wife states that she thinks that he got it from when she had to pull him by his heels across the floor to move him when he was frozen.  She states that it is almost healed now.  He had graduated from PT but has restarted it yesterday.    11/14/15 update:  The patient has a history of Parkinson's disease with Parkinson's disease dementia.  He is on carbidopa/levodopa 25/100, 2 tablets in the morning, followed by 7 other dosages spread evenly throughout the day.  He is also on carbidopa/levodopa 50/200 at nighttime.  His wife will occasionally give him an extra levodopa (she did today because she knew she was coming here and he would  freeze).  Last visit, his quetiapine was changed from 25 mg twice a day to 50 mg at night.  This was primarily because of issues with insomnia. He is now going to bed at 8:30 pm and staying in bed without awakening until 7am.   We did, however, last visit talk some about Nuplazid.  This is because of hallucinations.  Also because of this, I dropped his pramipexole from 0.25 mg 4 times a day to 0.25 mg twice per day. Wife states that this is much better.  Rarely having hallucinations now.  Did not seem to have any bad consequences from dropping the pramipexole to a lower dosage.  I have been doing this cautiously because he was unable to get off of that in the past.  He does have a history of Parkinson's disease dementia and is on the Exelon patch as well as Namenda, 10 mg twice a day.  His wife brings me a picture of his pressure wound on his right buttocks and reports he is still doing wound care but it is getting better.  He is exercising on his cardiofit and has gone back to the [employer].  He fell twice since last visit; one time fell forward out of the chair while eating.  He didn't get hurt.  With the other, he was in the bathroom and didn't have his walker.  He was hanging his towel up.  Wife asks me about him doing a colonoscopy.  States that his primary care physician would like him to do that.  She is reluctant.  03/19/16 update:  The patient has a history of Parkinson's disease with Parkinson's disease dementia.  This patient is accompanied in the office by his spouse who supplements the history.  He is on carbidopa/levodopa 25/100, 2 tablets in the morning, followed by 7 other dosages spread evenly throughout the day.  He is also on carbidopa/levodopa 50/200 at nighttime.  I tried to wean him off of the mirapex but his wife states that he "couldn't do it" and he is only on 0.25 mg bid.    He remains on azilect.  He is on quetiapine 50 mg at night.  This was primarily because of issues with insomnia, which he  is still having.  We did, however, last visit talk some about Nuplazid for hallucinations but have held on that thus far.  He states that hallucinations have gotten a bit worse.  Wife states that was really bad when she was giving him tylenol PM but she stopped that and is giving melatonin for insomnia now.   He does have a history of Parkinson's disease dementia and is on the Exelon patch as well as Namenda, 10 mg  twice a day.  He is exercising several times a week at the [facility], doing biking.  Has had multiple falls.  Multiple episodes of freezing.  However, doesn't like to use the walker at home.    06/20/16 update:  The patient has a history of Parkinson's disease with Parkinson's disease dementia.  This patient is accompanied in the office by his spouse who supplements the history.  He is on carbidopa/levodopa 25/100, 2 tablets in the morning, followed by 7 other dosages spread evenly throughout the day.  He is also on carbidopa/levodopa 50/200 at nighttime.  He is also still on low-dose pramipexole at 0.25 mg twice a day.  Despite trying to wean this off in the past, he was not able to get off of it and went back on it.  He is on Azilect, 1 mg daily.  He has Parkinson's disease dementia and is on Exelon Patch and Namenda.  Last visit, after checking his QT interval which was normal, we added Nuplazid for hallucinations.  He did well after a couple of weeks, and then began to have some freezing and his wife thought that this was medication related.  However, he also had a pressure ulcer and urinary urgency at the time.  I urged him to get checked by primary care before we went off of the medication.  They did not call back since that time.  He is also on very low-dose Seroquel for insomnia.  Pt and wife are not sure if the nuplazid it is helpful but think that maybe it has be (but really aren't sure).  2 episodes of freezing so much that he had to go to the ER in [place].  State that the ER did not  help them but they weren't sure what else to do.  Has at least 4 hours of off time per day if not more.  09/24/16 update:  Pt is f/u today, accompanied by his wife who supplements the history.  I sent him for a c/s for GJ tube placement for administration of duopa.  Felt to be a good candidate but patient has not yet proceeded with the procedure.  States today that he has decided that he doesn't want to go through with the procedure. He is still on carbidopa/levodopa 25/100, 2 in the AM followed by 7 other dosages during the day and then carbidopa/levodopa 50/200 at night.  He is on pramipexole 0.25 mg bid.  Still on nuplazid 34 mg daily for hallucinations.  His wife states that he is much better and "she can reason with him much better if he has the hallucinations."   He is on seroquel at low dose as well, and pt/wife understand risks of combining these meds.  He is having no SE of exelon/namenda although wife not sure that they help.  Memory has slowly been deteriorating for quite some time.  He is biking at the [facility] [place] and has lost 20 lbs that way.  That has helped him and today was one of the first freezing episodes he has had.  Sleeping better.  He had 3 falls since last visit.  2 were at the [facility] and he was walking and just fell forward and he got right back up without getting up.  Last fall was last week and it was at the mall in [place].    12/26/16 update:  Pt f/u today.  This patient is accompanied in the office by his spouse who supplements the history.  No taking carbidopa/levodopa 25/100, 2 po tid and then carbidopa/levodopa 50/200 at night.   On both nuplazid, 34 mg and seroquel, 50 mg.  Wife understands that these are both atypical antipsychotics and understands risk of combining these meds but thinks that quality of life with these is most important.  Has done much better with hallucinations and only has them in the car and thinks that head rests in the car are people.  No falls at all.   Wife states that he has really has devoted to exercise and has been moving better and able to stay up later now.  Has trouble getting in and out of the bed and wife has to help him with this.  Wife has to life the legs.   On 0.25 mg bid of pramipexole.  Tried several times to d/c without success.  Exercising at [facility].  No lightheadedness or near syncope.  Mood is good.  He did get lost on one occasion.  Wife left him just for a second and wondered to neighbors  06/05/17 update:  Patient seen today in follow-up.  He is accompanied by his wife who supplements the history.  I have reviewed records since last visit.  He is now on carbidopa/levodopa 25/100, 2 tablets 4 times per day (7:30am/11am/2pm/5:30) and carbidopa/levodopa 50/200 at night.  Noted more freezing.  His wife will given him an extra 1/2 as needed but more often than not he has needed it.   His Nuplazid was approved since last visit and he remains on that, 34 mg daily.  He is also on quetiapine, 50 mg daily.  I have tried to get him off of pramipexole because of his issues with cognition, but he went back on it.  He is only on 0.25 mg twice per day.  Wife did call in June because of increased falls, but he was on codeine at the time.  I suspected that that was the etiology.  Wife agrees and when the codeine was d/c falls were better.  Hallucinations were also worse on the codeine and not back to baseline  10/09/17 update:  Pt seen in f/u.  The records that were made available to me were reviewed.  This patient is accompanied in the office by his spouse who supplements the history.On carbidopa/levodopa 25/100, 2 po qid and carbidopa/levodopa 50/200 q hs.  On seroquel 50 mg at night in addition to nuplazid (risks known).  Pt/wife state that since last visit confusion has been worse, but hallucinations have actually been better.  This is primarily due to UTI.  He was hospitalized for this in September.  Put on myrbetriq and vesicare in October.  I reviewed  his [place] urology records.  The vesicare was d/c due to more confusion.  Wife states that the UTI's generated confusion.  This is better somewhat.  Numerous falls due to more freezing with UTI's.  Hit head with falls but doing ok.  Refuses to use walker and he tripped over the curb.  Patient refuses adult day care at the [facility].  Burden of caregiving is growing for wife.  01/12/18 update: Patient is seen today in follow-up.  He is accompanied by his wife who supplements the history.  The patient is on carbidopa/levodopa 25/100, 2 tablets 4 times per day and carbidopa/levodopa 50/200 at night.  He has had no falls since our last visit.  Confusion remains an issue, but hallucinations are not as common as in the past.  He is on both  Nuplazid and low-dose quetiapine.  He is also on Exelon and Namenda.  They met with our social worker last visit and was given resources for respite care and [place]. ***  PREVIOUS MEDICATIONS: Sinemet, Mirapex and Seroquel; he tried klonopin - 0.5 mg - full tablet at bedtime but seemed to "make worse"; vesicare (increased confusion)  ALLERGIES:   Allergies  Allergen Reactions  . Naproxen Swelling    CURRENT MEDICATIONS:  Outpatient Encounter Medications as of 01/12/2018  Medication Sig  . carbidopa-levodopa (SINEMET CR) 50-200 MG tablet TAKE 1 TABLET BY MOUTH AT BEDTIME.  . carbidopa-levodopa (SINEMET IR) 25-100 MG tablet 2 tablets QID and 1 PRN daily  . carbidopa-levodopa (SINEMET IR) 25-100 MG tablet 2 tablets at 7am/10am/1pm/4pm and 1 at 7pm PRN  . escitalopram (LEXAPRO) 10 MG tablet Take 10 mg by mouth daily with supper.  . hydrochlorothiazide (HYDRODIURIL) 25 MG tablet Take 25 mg by mouth daily.   . memantine (NAMENDA) 10 MG tablet TAKE 1 TABLET (10 MG TOTAL) BY MOUTH 2 (TWO) TIMES DAILY.  . mirabegron ER (MYRBETRIQ) 50 MG TB24 tablet Take 50 mg by mouth daily.  . NUPLAZID 34 MG CAPS TAKE ONE CAPSULE BY MOUTH DAILY  . QUEtiapine (SEROQUEL) 25 MG tablet  TAKE 2 TABLETS (50 MG TOTAL) BY MOUTH AT BEDTIME.  . rivastigmine (EXELON) 9.5 mg/24hr PLACE 1 PATCH (9.5 MG TOTAL) ONTO THE SKIN DAILY.   No facility-administered encounter medications on file as of 01/12/2018.     PAST MEDICAL HISTORY:   Past Medical History:  Diagnosis Date  . Hernia of abdominal cavity   . Parkinson's disease ([place])     PAST SURGICAL HISTORY:   Past Surgical History:  Procedure Laterality Date  . HERNIA REPAIR    . IR GENERIC HISTORICAL  08/20/2016   IR RADIOLOGIST EVAL & MGMT 08/20/2016 Markus Daft, MD [facility] INTERV RAD    SOCIAL HISTORY:   Social History   Socioeconomic History  . Marital status: Married    Spouse name: Not on file  . Number of children: Not on file  . Years of education: Not on file  . Highest education level: Not on file  Social Needs  . Financial resource strain: Not on file  . Food insecurity - worry: Not on file  . Food insecurity - inability: Not on file  . Transportation needs - medical: Not on file  . Transportation needs - non-medical: Not on file  Occupational History  . Not on file  Tobacco Use  . Smoking status: Former Research scientist (life sciences)  . Smokeless tobacco: Never Used  Substance and Sexual Activity  . Alcohol use: Yes    Alcohol/week: 0.0 oz  . Drug use: No  . Sexual activity: Not on file  Other Topics Concern  . Not on file  Social History Narrative   Lives with wife in story home.  Has no children.  Retired Geophysical data processor for [employer]  Education: associate's degree.    FAMILY HISTORY:   Family Status  Relation Name Status  . Mother  Deceased  . Father  Deceased  . Brother  Deceased    ROS:  A complete 10 system review of systems was obtained and was unremarkable apart from what is mentioned above.  PHYSICAL EXAMINATION:    VITALS:   There were no vitals filed for this visit. Wt Readings from Last 3 Encounters:  06/05/17 161 lb (73 kg)  12/26/16 158 lb (71.7 kg)  08/20/16 150 lb (68 kg)  GEN:  The  patient appears stated age and is in NAD. HEENT:  Normocephalic, atraumatic.  The mucous membranes are moist. The superficial temporal arteries are without ropiness or tenderness. CV:  RRR Lungs:  CTAB Neck/HEME:  There are no carotid bruits bilaterally.  Neurological examination:  Orientation: He scored an 8/30 on his MoCA previously.  He is alert and oriented x2. Cranial nerves: There is good facial symmetry.  There is significant facial hypomimia.    The speech is fluent and clear.  He is hypophonic.  Soft palate rises symmetrically and there is no tongue deviation. Hearing is intact to conversational tone. Sensation: Sensation is intact to light touch throughout. Motor: Strength is at least antigravity x4.   Movement examination: Tone: There is normal tone in the bilateral upper extremities.  The tone in the lower extremities is normal.  Abnormal movements: There is dyskinesia in the LLE extremity Coordination:  There is mild decremation with RAM's, with all form of RAMS, including alternating supination and pronation of the forearm, hand opening and closing, finger taps, heel taps and toe taps bilaterally.  Gait and Station: The patient requires some help out of the chair.  He has significant start hesitation.  He requires significant assistance from the examiner when ambulating.  He has freezing.  He has fairly significant apraxia of gait.  Once back in the room and done testing ambulation, he has to be asked 6-8 times to sit back down in the wheelchair.  He just could not understand the command.  Labs:    Chemistry      Component Value Date/Time   NA 141 04/04/2017 1340   K 3.3 (L) 04/04/2017 1340   CL 103 04/04/2017 1340   CO2 25 04/04/2017 1340   BUN 19 04/04/2017 1340   CREATININE 0.87 04/04/2017 1340      Component Value Date/Time   CALCIUM 9.4 04/04/2017 1340   ALKPHOS 70 04/04/2017 1340   AST 18 04/04/2017 1340   ALT 6 04/04/2017 1340   BILITOT 0.5 04/04/2017 1340      Lab Results  Component Value Date   WBC 9.4 04/04/2017   HGB 14.1 04/04/2017   HCT 42.3 04/04/2017   MCV 98.5 04/04/2017   PLT 329.0 04/04/2017   ADDENDUM LABS:  Labs were received and dated 07/04/2017.  White blood cells were 9.0, hemoglobin 14.8, hematocrit 43.1 and platelets 275.  Sodium was 140, potassium 3.7, chloride 104, CO2 28, BUN 16 and creatinine 0.78 and glucose 114.  AST was 11, ALT less than 3, alkaline phosphatase 69.  ASSESSMENT/PLAN:  1.  Parkinsons disease with advanced Parkinsons disease dementia  -The patient will continue carbidopa/levodopa 25/100 as follows: 2 tablets at 7am/10am/1pm/4pm and then if he needs an extra 1/2 to 1 tablet at 7pm, he can take that.    -Continue carbidopa/levodopa 50/200 CR at bedtime  -Have successfully weaned the patient off of pramipexole since last visit.  -talked again about duopa.  He had a consult and was felt to be a good candidate.  He even met with a patient representative, but ultimately the patient decided against having it.    -Unfortunately, the patient has stopped exercising.  He has refused to go to adult daycare.  I talked to the patient extensively about this today.  I talked to him about the importance of his wife getting respite care.  I talked to him about at least visiting the adult daycare center.  I am going to have my social  worker look into any further respite care opportunities available for his wife.  I invited his wife to the Parkinson's caregiver support group.  -He is now off of Azilect. 2.  RBD  -has tried klonopin but thinks that he had SE (today states that he just thinks that it didn't work).  Will need to monitor.  If need in future, will try 1/2 of the tablet or even less.   3.  PDD  -continue exelon patch 9.5 mg daily  -on namenda 10 mg bid.   -home safety discussed in detail.  Shouldn't be left alone.  -Patient's wife thinks that Nuplazid, 34 mg, is helping.    Patient and his wife understand the  blackbox warning.  Understand that it, especially along with Seroquel, can prolong QT interval.  They understand what this means. 4.  Insomnia  -on 50 mg seroquel and melatonin  -Talked to them about the interaction between Nuplazid and Seroquel.  Talked about the fact that they are both atypical antipsychotic medications.  Both of these have a black box warning.  Discussed with the blackbox warning was in detail with the patient and his wife.  He does not have prolonged QT.  The patient and his wife decided that the benefits outweigh the risks.  Continue both for now 5.  ***

## 2018-01-10 ENCOUNTER — Other Ambulatory Visit: Payer: Self-pay | Admitting: Neurology

## 2018-01-12 ENCOUNTER — Telehealth: Payer: Self-pay | Admitting: Neurology

## 2018-01-12 ENCOUNTER — Ambulatory Visit: Payer: Medicare Other | Admitting: Neurology

## 2018-01-12 NOTE — Telephone Encounter (Signed)
Patient wife called and states that he has fallen several times and fell last night and is out of it. She would like to talk to someone about it

## 2018-01-12 NOTE — Telephone Encounter (Addendum)
Spoke with patient's wife.   She cancelled appt today because she can not get patient here. She states starting this weekend he has been uncontrollable. He has been confused about where is at and yelling at his wife.   He has fallen x2. Did not hit his head. Checked out by EMS.   Wife states just very out of it. She can't get him dressed. He is urinating and having bowel movements on himself.   Made her aware she should have him checked out at urgent care for UTI/other possible reasons for sudden change.   She will try to get him to a place to have him checked out.   Dr. Arbutus Leasat [place]- FYI.

## 2018-01-12 NOTE — Telephone Encounter (Signed)
Patient's wife made aware.

## 2018-01-12 NOTE — Telephone Encounter (Signed)
Agree but I would say ER.

## 2018-02-14 ENCOUNTER — Other Ambulatory Visit: Payer: Self-pay | Admitting: Neurology

## 2018-02-18 NOTE — Progress Notes (Addendum)
Manuel Jackson was seen today in the movement disorders clinic for neurologic consultation at the request of [name].  His PCP is [name], MD.  The patient is seen today in neurologic consultation, accompanied by his wife who supplements the history.  He has been taken care of for many years by [name], but the drive to [facility] has become long and cumbersome for him and he is trying to find a neurologist closer to home.  I have reviewed and appreciated the records from [name].  He first saw [name] and was diagnosed with Parkinson's disease in approximately 2008, when [name] was with [facility] neurology.  He followed [name] to [facility].  The patient's first symptom was right hand rest tremor.  He was started on Azilect first and Mirapex came not long thereafter.  He has been on levodopa since approximately 2011.  The patient felt that each of these medications helped either tremor or balance or both at the initiation of the medication, but is having more difficulty now with freezing and on/off.  He developed hallucinations in approximately May, 2013 and did not fully respond to quetiapine.  He saw [name] for a one-time consultation in 2013 and [name] decided to taper him off of Mirapex.  Unfortunately, the patient became very stiff off of Mirapex and he ended up going back on the drug and is now back on it at pramipexole, 0.25 mg 4 times per day.  He is also on the Exelon patch 9.5 mg daily, Namenda 10 mg twice a day as well as his Seroquel 25 mg bid; he is still having hallucinations despite this.  As above, he has had more difficulties with motor fluctuations and called [name] in June and his dose of carbidopa/levodopa was adjusted slightly so that he is now taking 2 tablets at 8 AM/2 at noon/1 tablet at 2 PM/1 tablet at 4 PM/2 tablets at 6:30 PM and one tablet at bedtime.  Pt states that his medication works right after he takes the medication but by 11 am he  is frozen.  He states that medication wears out after 3 hours.  His wife states that she has been taking the 2 tablets at noon and starting giving one at 10 am and one at noon and that works better.  Interestingly, he wakes up at 6 am but doesn't take first med until 8am.     09/28/15 update:  The patient is following up today, accompanied by his wife who supplements the history.  He has a history of Parkinson's disease with Parkinson's disease dementia.  He is on carbidopa/levodopa 25/100, 2 tablets in the morning and 7 other tablets spread evenly throughout the day.  Last visit we added carbidopa/levodopa 50/200 at nighttime.  Despite attempts in the past by his previous physicians to get him off of Mirapex, he ended up going back on it and is on Mirapex, 0.25 mg 4 times per day.  He is on Seroquel 25 mg twice a day and last visit we discussed adding Nuplazid.  We also discussed the possibilities of duopa because of motor fluctuations.  He is on Exelon patch, 9.5 mg daily and Namenda, 10 mg twice a day.  He continues to have hallucinations.  He has had a few falls since last visit and the days that he falls are the days that he has not slept the night before.  He still isn't sleeping well.  He refuses to  use a walker or ambulatory assistive device.  He has a pressure sore.  His wife states that she thinks that he got it from when she had to pull him by his heels across the floor to move him when he was frozen.  She states that it is almost healed now.  He had graduated from PT but has restarted it yesterday.    11/14/15 update:  The patient has a history of Parkinson's disease with Parkinson's disease dementia.  He is on carbidopa/levodopa 25/100, 2 tablets in the morning, followed by 7 other dosages spread evenly throughout the day.  He is also on carbidopa/levodopa 50/200 at nighttime.  His wife will occasionally give him an extra levodopa (she did today because she knew she was coming here and he would  freeze).  Last visit, his quetiapine was changed from 25 mg twice a day to 50 mg at night.  This was primarily because of issues with insomnia. He is now going to bed at 8:30 pm and staying in bed without awakening until 7am.   We did, however, last visit talk some about Nuplazid.  This is because of hallucinations.  Also because of this, I dropped his pramipexole from 0.25 mg 4 times a day to 0.25 mg twice per day. Wife states that this is much better.  Rarely having hallucinations now.  Did not seem to have any bad consequences from dropping the pramipexole to a lower dosage.  I have been doing this cautiously because he was unable to get off of that in the past.  He does have a history of Parkinson's disease dementia and is on the Exelon patch as well as Namenda, 10 mg twice a day.  His wife brings me a picture of his pressure wound on his right buttocks and reports he is still doing wound care but it is getting better.  He is exercising on his cardiofit and has gone back to the [employer].  He fell twice since last visit; one time fell forward out of the chair while eating.  He didn't get hurt.  With the other, he was in the bathroom and didn't have his walker.  He was hanging his towel up.  Wife asks me about him doing a colonoscopy.  States that his primary care physician would like him to do that.  She is reluctant.  03/19/16 update:  The patient has a history of Parkinson's disease with Parkinson's disease dementia.  This patient is accompanied in the office by his spouse who supplements the history.  He is on carbidopa/levodopa 25/100, 2 tablets in the morning, followed by 7 other dosages spread evenly throughout the day.  He is also on carbidopa/levodopa 50/200 at nighttime.  I tried to wean him off of the mirapex but his wife states that he "couldn't do it" and he is only on 0.25 mg bid.    He remains on azilect.  He is on quetiapine 50 mg at night.  This was primarily because of issues with insomnia, which he  is still having.  We did, however, last visit talk some about Nuplazid for hallucinations but have held on that thus far.  He states that hallucinations have gotten a bit worse.  Wife states that was really bad when she was giving him tylenol PM but she stopped that and is giving melatonin for insomnia now.   He does have a history of Parkinson's disease dementia and is on the Exelon patch as well as Namenda, 10 mg  twice a day.  He is exercising several times a week at the [facility], doing biking.  Has had multiple falls.  Multiple episodes of freezing.  However, doesn't like to use the walker at home.    06/20/16 update:  The patient has a history of Parkinson's disease with Parkinson's disease dementia.  This patient is accompanied in the office by his spouse who supplements the history.  He is on carbidopa/levodopa 25/100, 2 tablets in the morning, followed by 7 other dosages spread evenly throughout the day.  He is also on carbidopa/levodopa 50/200 at nighttime.  He is also still on low-dose pramipexole at 0.25 mg twice a day.  Despite trying to wean this off in the past, he was not able to get off of it and went back on it.  He is on Azilect, 1 mg daily.  He has Parkinson's disease dementia and is on Exelon Patch and Namenda.  Last visit, after checking his QT interval which was normal, we added Nuplazid for hallucinations.  He did well after a couple of weeks, and then began to have some freezing and his wife thought that this was medication related.  However, he also had a pressure ulcer and urinary urgency at the time.  I urged him to get checked by primary care before we went off of the medication.  They did not call back since that time.  He is also on very low-dose Seroquel for insomnia.  Pt and wife are not sure if the nuplazid it is helpful but think that maybe it has be (but really aren't sure).  2 episodes of freezing so much that he had to go to the ER in [place].  State that the ER did not  help them but they weren't sure what else to do.  Has at least 4 hours of off time per day if not more.  09/24/16 update:  Pt is f/u today, accompanied by his wife who supplements the history.  I sent him for a c/s for GJ tube placement for administration of duopa.  Felt to be a good candidate but patient has not yet proceeded with the procedure.  States today that he has decided that he doesn't want to go through with the procedure. He is still on carbidopa/levodopa 25/100, 2 in the AM followed by 7 other dosages during the day and then carbidopa/levodopa 50/200 at night.  He is on pramipexole 0.25 mg bid.  Still on nuplazid 34 mg daily for hallucinations.  His wife states that he is much better and "she can reason with him much better if he has the hallucinations."   He is on seroquel at low dose as well, and pt/wife understand risks of combining these meds.  He is having no SE of exelon/namenda although wife not sure that they help.  Memory has slowly been deteriorating for quite some time.  He is biking at the [facility] [place] and has lost 20 lbs that way.  That has helped him and today was one of the first freezing episodes he has had.  Sleeping better.  He had 3 falls since last visit.  2 were at the [facility] and he was walking and just fell forward and he got right back up without getting up.  Last fall was last week and it was at the mall in [place].    12/26/16 update:  Pt f/u today.  This patient is accompanied in the office by his spouse who supplements the history.  No taking carbidopa/levodopa 25/100, 2 po tid and then carbidopa/levodopa 50/200 at night.   On both nuplazid, 34 mg and seroquel, 50 mg.  Wife understands that these are both atypical antipsychotics and understands risk of combining these meds but thinks that quality of life with these is most important.  Has done much better with hallucinations and only has them in the car and thinks that head rests in the car are people.  No falls at all.   Wife states that he has really has devoted to exercise and has been moving better and able to stay up later now.  Has trouble getting in and out of the bed and wife has to help him with this.  Wife has to life the legs.   On 0.25 mg bid of pramipexole.  Tried several times to d/c without success.  Exercising at [facility].  No lightheadedness or near syncope.  Mood is good.  He did get lost on one occasion.  Wife left him just for a second and wondered to neighbors  06/05/17 update:  Patient seen today in follow-up.  He is accompanied by his wife who supplements the history.  I have reviewed records since last visit.  He is now on carbidopa/levodopa 25/100, 2 tablets 4 times per day (7:30am/11am/2pm/5:30) and carbidopa/levodopa 50/200 at night.  Noted more freezing.  His wife will given him an extra 1/2 as needed but more often than not he has needed it.   His Nuplazid was approved since last visit and he remains on that, 34 mg daily.  He is also on quetiapine, 50 mg daily.  I have tried to get him off of pramipexole because of his issues with cognition, but he went back on it.  He is only on 0.25 mg twice per day.  Wife did call in June because of increased falls, but he was on codeine at the time.  I suspected that that was the etiology.  Wife agrees and when the codeine was d/c falls were better.  Hallucinations were also worse on the codeine and not back to baseline  10/09/17 update:  Pt seen in f/u.  The records that were made available to me were reviewed.  This patient is accompanied in the office by his spouse who supplements the history.On carbidopa/levodopa 25/100, 2 po qid and carbidopa/levodopa 50/200 q hs.  On seroquel 50 mg at night in addition to nuplazid (risks known).  Pt/wife state that since last visit confusion has been worse, but hallucinations have actually been better.  This is primarily due to UTI.  He was hospitalized for this in September.  Put on myrbetriq and vesicare in October.  I reviewed  his [place] urology records.  The vesicare was d/c due to more confusion.  Wife states that the UTI's generated confusion.  This is better somewhat.  Numerous falls due to more freezing with UTI's.  Hit head with falls but doing ok.  Refuses to use walker and he tripped over the curb.  Patient refuses adult day care at the [facility].  Burden of caregiving is growing for wife.  02/19/18 update: Patient is seen today in follow-up, accompanied by his wife who supplements the history.  The patient remains on carbidopa/levodopa 25/100, 2 tablets 4 times per day in addition to carbidopa/levodopa 50/200 at night.  He usually takes an extra carbidopa/levodopa 25/100 in the day for freezing.  He is on both Seroquel, 50 mg at night and nuplazid, knowing that these are both antipsychotics.  Had appt last month but had to cx because of increasing confusion and agitation.  He had also had falls x 2.  Had bladder and bowel incontinence.  Was told to take him in to be checked for infection/uti.  I don't see any ER records since then.  He was checked out by PCP.  UA not done because pt told PCP that he couldn't get sample.  Haven't been back to urology as his physician left the practice.  Wife wonders if myrbetriq contributing to MS change.  Wife states that hallucinations have been much better.  Wife states that pt with poor motivation and doesn't want to leave house.  Pt not sure why he does this.  Doesn't even want to brush own teeth and wife did it for him this AM.  Has someone coming in 2 days per week for bathing.  64 y/o niece moving in with them to help with care.  PREVIOUS MEDICATIONS: Sinemet, Mirapex and Seroquel; he tried klonopin - 0.5 mg - full tablet at bedtime but seemed to "make worse"; vesicare (increased confusion)  ALLERGIES:   Allergies  Allergen Reactions  . Naproxen Swelling    CURRENT MEDICATIONS:  Outpatient Encounter Medications as of 02/19/2018  Medication Sig  . carbidopa-levodopa  (SINEMET CR) 50-200 MG tablet TAKE 1 TABLET BY MOUTH AT BEDTIME.  . carbidopa-levodopa (SINEMET IR) 25-100 MG tablet 2 tablets at 7am/10am/1pm/4pm and 1 at 7pm PRN  . escitalopram (LEXAPRO) 10 MG tablet Take 10 mg by mouth daily with supper.  . hydrochlorothiazide (HYDRODIURIL) 25 MG tablet Take 25 mg by mouth daily.   . memantine (NAMENDA) 10 MG tablet TAKE 1 TABLET BY MOUTH TWICE A DAY  . mirabegron ER (MYRBETRIQ) 50 MG TB24 tablet Take 50 mg by mouth daily.  . NUPLAZID 34 MG CAPS TAKE ONE CAPSULE BY MOUTH DAILY  . QUEtiapine (SEROQUEL) 25 MG tablet TAKE 2 TABLETS (50 MG TOTAL) BY MOUTH AT BEDTIME.  . rivastigmine (EXELON) 9.5 mg/24hr PLACE 1 PATCH (9.5 MG TOTAL) ONTO THE SKIN DAILY.  Marland Kitchen Levodopa (INBRIJA) 42 MG CAPS Place 1 capsule into inhaler and inhale as needed.  . [DISCONTINUED] carbidopa-levodopa (SINEMET IR) 25-100 MG tablet 2 tablets QID and 1 PRN daily   No facility-administered encounter medications on file as of 02/19/2018.     PAST MEDICAL HISTORY:   Past Medical History:  Diagnosis Date  . Hernia of abdominal cavity   . Parkinson's disease ([place])     PAST SURGICAL HISTORY:   Past Surgical History:  Procedure Laterality Date  . HERNIA REPAIR    . IR GENERIC HISTORICAL  08/20/2016   IR RADIOLOGIST EVAL & MGMT 08/20/2016 Markus Daft, MD [facility] INTERV RAD    SOCIAL HISTORY:   Social History   Socioeconomic History  . Marital status: Married    Spouse name: Not on file  . Number of children: Not on file  . Years of education: Not on file  . Highest education level: Not on file  Occupational History  . Not on file  Social Needs  . Financial resource strain: Not on file  . Food insecurity:    Worry: Not on file    Inability: Not on file  . Transportation needs:    Medical: Not on file    Non-medical: Not on file  Tobacco Use  . Smoking status: Former Research scientist (life sciences)  . Smokeless tobacco: Never Used  Substance and Sexual Activity  . Alcohol use: Yes    Alcohol/week:  0.0 oz  .  Drug use: No  . Sexual activity: Not on file  Lifestyle  . Physical activity:    Days per week: Not on file    Minutes per session: Not on file  . Stress: Not on file  Relationships  . Social connections:    Talks on phone: Not on file    Gets together: Not on file    Attends religious service: Not on file    Active member of club or organization: Not on file    Attends meetings of clubs or organizations: Not on file    Relationship status: Not on file  . Intimate partner violence:    Fear of current or ex partner: Not on file    Emotionally abused: Not on file    Physically abused: Not on file    Forced sexual activity: Not on file  Other Topics Concern  . Not on file  Social History Narrative   Lives with wife in story home.  Has no children.  Retired Geophysical data processor for [employer]  Education: associate's degree.    FAMILY HISTORY:   Family Status  Relation Name Status  . Mother  Deceased  . Father  Deceased  . Brother  Deceased    ROS:  A complete 10 system review of systems was obtained and was unremarkable apart from what is mentioned above.  PHYSICAL EXAMINATION:    VITALS:   Vitals:   02/19/18 1422  BP: 134/78  Pulse: 64  SpO2: 98%   Wt Readings from Last 3 Encounters:  06/05/17 161 lb (73 kg)  12/26/16 158 lb (71.7 kg)  08/20/16 150 lb (68 kg)     GEN:  The patient appears stated age and is in NAD. HEENT:  Normocephalic, atraumatic.  The mucous membranes are moist. The superficial temporal arteries are without ropiness or tenderness. CV:  RRR Lungs:  CTAB Neck/HEME:  There are no carotid bruits bilaterally.  Neurological examination:  Orientation: He is alert and oriented x 2 Cranial nerves: There is good facial symmetry.  There is significant facial hypomimia.    The speech is fluent and clear.  He is hypophonic.  Soft palate rises symmetrically and there is no tongue deviation. Hearing is intact to conversational tone. Sensation:  Sensation is intact to light touch throughout. Motor: Strength is at least antigravity x4.   Movement examination: Tone: There is normal tone in the bilateral upper extremities.  The tone in the lower extremities is normal.  Abnormal movements: There is no dyskinesia today.  No tremor Coordination:  There is mild decremation with RAM's, with all form of RAMS, including alternating supination and pronation of the forearm, hand opening and closing, finger taps, heel taps and toe taps bilaterally.  Gait and Station: did not ambulate pt today.  In [facility]  Labs:    Chemistry      Component Value Date/Time   NA 141 04/04/2017 1340   K 3.3 (L) 04/04/2017 1340   CL 103 04/04/2017 1340   CO2 25 04/04/2017 1340   BUN 19 04/04/2017 1340   CREATININE 0.87 04/04/2017 1340      Component Value Date/Time   CALCIUM 9.4 04/04/2017 1340   ALKPHOS 70 04/04/2017 1340   AST 18 04/04/2017 1340   ALT 6 04/04/2017 1340   BILITOT 0.5 04/04/2017 1340     Lab Results  Component Value Date   WBC 9.4 04/04/2017   HGB 14.1 04/04/2017   HCT 42.3 04/04/2017   MCV 98.5 04/04/2017  PLT 329.0 04/04/2017   ADDENDUM LABS:  Labs were received and dated January 02, 2018.  Sodium 141, potassium 3.7, chloride 104, CO2 30, BUN 17 and creatinine 0.77.  White blood cells 8.5, hemoglobin 14.1, hematocrit 41.8 and platelets 326.  ASSESSMENT/PLAN:  1.  Parkinsons disease with advanced Parkinsons disease dementia  -The patient will continue carbidopa/levodopa 25/100 as follows: 2 tablets at 7am/10am/1pm/4pm and  1 tablet at 7pm.  -continue carbidopa/levodopa 50/200 q hs  -having unpredictable freezing in the shower.  Discussed inbrija.  They are interested.  Given samples and shown how to use.  Risks, benefits, side effects and alternative therapies were discussed.  The opportunity to ask questions was given and they were answered to the best of my ability.  The patient expressed understanding and willingness to follow the  outlined treatment protocols.  -will refer for home PT/OT  -met with our social worker today 2.  RBD  -has tried klonopin but thinks that he had SE (today states that he just thinks that it didn't work).  Will need to monitor.  If need in future, will try 1/2 of the tablet or even less.   3.  PDD  -continue exelon patch 9.5 mg daily  -on namenda 10 mg bid.   -Patient's wife thinks that Nuplazid, 34 mg, is helping.    Patient and his wife understand the blackbox warning.  Understand that it, especially along with Seroquel, can prolong QT interval.  Same is true with lexapro.  They understand what this means. Will do EKG today 4.  Depression  -on lexapro by PCP  -slightly increase seroquel to 25 mg in the AM and continue 50 mg at night. 5.  Follow up is anticipated in the next few months, sooner should new neurologic issues arise.  Much greater than 50% of this visit was spent in counseling and coordinating care.  Total face to face time:  30 min

## 2018-02-19 ENCOUNTER — Encounter: Payer: Self-pay | Admitting: Neurology

## 2018-02-19 ENCOUNTER — Ambulatory Visit (INDEPENDENT_AMBULATORY_CARE_PROVIDER_SITE_OTHER): Payer: Medicare Other | Admitting: Neurology

## 2018-02-19 VITALS — BP 134/78 | HR 64

## 2018-02-19 DIAGNOSIS — F0280 Dementia in other diseases classified elsewhere without behavioral disturbance: Secondary | ICD-10-CM

## 2018-02-19 DIAGNOSIS — G4752 REM sleep behavior disorder: Secondary | ICD-10-CM

## 2018-02-19 DIAGNOSIS — F330 Major depressive disorder, recurrent, mild: Secondary | ICD-10-CM | POA: Diagnosis not present

## 2018-02-19 DIAGNOSIS — Z5181 Encounter for therapeutic drug level monitoring: Secondary | ICD-10-CM

## 2018-02-19 DIAGNOSIS — G20 Parkinson's disease: Secondary | ICD-10-CM

## 2018-02-19 MED ORDER — LEVODOPA 42 MG IN CAPS: 1.0000 | ORAL_CAPSULE | RESPIRATORY_TRACT | 0 refills | Status: AC | PRN

## 2018-02-19 NOTE — Patient Instructions (Addendum)
1. Will send order to [facility] for physical and occupational therapy. They will contact you directly.   2. Increase Seroquel 25 mg to 1/2 tablet in the morning and continue 50 mg at bedtime.   3. EKG: will have done with Dr. Manus GunningEhinger today.

## 2018-02-24 ENCOUNTER — Telehealth: Payer: Self-pay | Admitting: Neurology

## 2018-02-24 NOTE — Telephone Encounter (Signed)
Received EKG from PCP.  QT/QTc: 432/451. LMOM making patient/wife aware.  Dr. Arbutus Leas [place] Childes.

## 2018-03-05 ENCOUNTER — Telehealth: Payer: Self-pay | Admitting: Neurology

## 2018-03-05 NOTE — Telephone Encounter (Signed)
Received approval for Inbrija 02/20/18. Good til 10/27/2018. Specialty pharmacy [facility]. Ph. [phone]. Fax. [phone].

## 2018-03-27 ENCOUNTER — Other Ambulatory Visit: Payer: Self-pay | Admitting: Neurology

## 2018-03-28 ENCOUNTER — Other Ambulatory Visit: Payer: Self-pay | Admitting: Neurology

## 2018-04-02 ENCOUNTER — Telehealth: Payer: Self-pay | Admitting: Neurology

## 2018-04-02 NOTE — Telephone Encounter (Signed)
Noted ok. 

## 2018-04-02 NOTE — Telephone Encounter (Signed)
Patient wife states that she does not think the medication Inbrija is really helping patient. She would like to talk to someone about this

## 2018-04-02 NOTE — Telephone Encounter (Signed)
Patient's wife thinks maybe his dementia is too bad for him to understand how to inhale the inbrija. They want to keep trying it, but haven't noticed any benefit yet. Dr. Arbutus Leasat- fyi.

## 2018-04-02 NOTE — Telephone Encounter (Signed)
Left message on machine for patient's wife to call back.   

## 2018-04-09 ENCOUNTER — Ambulatory Visit: Payer: Medicare Other | Admitting: Neurology

## 2018-04-09 ENCOUNTER — Encounter

## 2018-04-13 ENCOUNTER — Other Ambulatory Visit: Payer: Self-pay | Admitting: Neurology

## 2018-04-22 ENCOUNTER — Telehealth: Payer: Self-pay | Admitting: *Deleted

## 2018-04-22 NOTE — Telephone Encounter (Signed)
Called and spoke with Tobi BastosAnna. Advised her Pt should be evaluated. She is taking him to the ED

## 2018-04-22 NOTE — Telephone Encounter (Signed)
Called and spoke with Tobi BastosAnna. Pt was not himself yesterday, he was talking to the TV, would not eat. He is more himself today, but would not eat at first. She was able to get him to eat very little about 20 min ago. Tobi Bastosnna is concerned there may be something wrong more than the PD or dementia, is questioning if she should take him to the ED to be evaluated.

## 2018-04-22 NOTE — Telephone Encounter (Signed)
I certainly would!  She knows him better than anyone and this sounds concerning.  They can make sure no UTI or something more serious.

## 2018-04-22 NOTE — Telephone Encounter (Signed)
Patient wife called stating they are having a few problems with patient acting strange. Tobi Bastosnna would ike to discuss with the nurse [phone]

## 2018-04-23 ENCOUNTER — Telehealth: Payer: Self-pay | Admitting: *Deleted

## 2018-04-23 NOTE — Telephone Encounter (Signed)
Manuel Jackson called for patient stating patient went to the er and everything is okay and they diagnosed him with failure to thrive as an adult.  [phone]

## 2018-04-23 NOTE — Telephone Encounter (Signed)
ER notes reviewed.  Nothing acute noted

## 2018-04-23 NOTE — Telephone Encounter (Signed)
FYI

## 2018-05-04 ENCOUNTER — Other Ambulatory Visit: Payer: Self-pay | Admitting: Neurology

## 2018-05-09 ENCOUNTER — Other Ambulatory Visit: Payer: Self-pay | Admitting: Neurology

## 2018-05-11 ENCOUNTER — Telehealth: Payer: Self-pay | Admitting: Neurology

## 2018-05-11 NOTE — Telephone Encounter (Signed)
Spoke with patient's wife and she was made aware. She is interested in getting information about "doctors making house calls". Shanda BumpsJessica - can you reach out to the wife and discuss caregiving options?

## 2018-05-11 NOTE — Telephone Encounter (Signed)
I know that patient's wife can not come on her own, but what would you like me to tell her about appt?

## 2018-05-11 NOTE — Telephone Encounter (Signed)
If she cannot get him here safely, then they should cancel the appt.  Has she considered housing out of the home (SNF).  If not, she could meet with Shanda BumpsJessica to discuss options and could meet with her alone without pt
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
## 2018-05-12 ENCOUNTER — Encounter

## 2018-05-12 ENCOUNTER — Ambulatory Visit: Payer: Medicare Other | Admitting: Neurology

## 2018-05-12 NOTE — Telephone Encounter (Signed)
TC with patient's wife.  We talked about resources as patient seems to need a higher level of care.  We talked a little bit about utilizing more resources at this time.  The patient's wife has looked into pace services.  In addition she is going to check on services with the [facility]. I provided her with the caregiver Support Line toll free at [phone]. In addition, we talked about [facility] and I provided her with that information.

## 2018-06-08 ENCOUNTER — Other Ambulatory Visit: Payer: Self-pay | Admitting: Neurology

## 2018-06-19 ENCOUNTER — Other Ambulatory Visit: Payer: Self-pay | Admitting: Neurology

## 2018-06-28 DEATH — deceased

## 2018-08-14 ENCOUNTER — Encounter

## 2018-08-14 ENCOUNTER — Ambulatory Visit: Payer: Medicare Other | Admitting: Neurology

## 2018-09-01 ENCOUNTER — Ambulatory Visit: Payer: Medicare Other | Admitting: Neurology
